# Patient Record
Sex: Male | Born: 1955 | Race: White | Hispanic: No | Marital: Single | Smoking: Never smoker
Health system: Southern US, Community
[De-identification: ages and names within clinical notes are randomized; demographics above are authoritative.]

## PROBLEM LIST (undated history)

## (undated) DIAGNOSIS — K219 Gastro-esophageal reflux disease without esophagitis: Secondary | ICD-10-CM

## (undated) DIAGNOSIS — E785 Hyperlipidemia, unspecified: Secondary | ICD-10-CM

## (undated) DIAGNOSIS — K5790 Diverticulosis of intestine, part unspecified, without perforation or abscess without bleeding: Secondary | ICD-10-CM

## (undated) DIAGNOSIS — G40909 Epilepsy, unspecified, not intractable, without status epilepticus: Secondary | ICD-10-CM

## (undated) DIAGNOSIS — K760 Fatty (change of) liver, not elsewhere classified: Secondary | ICD-10-CM

## (undated) DIAGNOSIS — N200 Calculus of kidney: Secondary | ICD-10-CM

## (undated) DIAGNOSIS — H9193 Unspecified hearing loss, bilateral: Secondary | ICD-10-CM

## (undated) DIAGNOSIS — L719 Rosacea, unspecified: Secondary | ICD-10-CM

## (undated) DIAGNOSIS — K2970 Gastritis, unspecified, without bleeding: Secondary | ICD-10-CM

## (undated) DIAGNOSIS — F419 Anxiety disorder, unspecified: Secondary | ICD-10-CM

## (undated) DIAGNOSIS — G4700 Insomnia, unspecified: Secondary | ICD-10-CM

## (undated) HISTORY — DX: Anxiety disorder, unspecified: F41.9

## (undated) HISTORY — DX: Gastritis, unspecified, without bleeding: K29.70

## (undated) HISTORY — DX: Rosacea, unspecified: L71.9

## (undated) HISTORY — DX: Insomnia, unspecified: G47.00

## (undated) HISTORY — DX: Calculus of kidney: N20.0

## (undated) HISTORY — DX: Hyperlipidemia, unspecified: E78.5

## (undated) HISTORY — PX: OTHER SURGICAL HISTORY: SHX169

## (undated) HISTORY — DX: Fatty (change of) liver, not elsewhere classified: K76.0

## (undated) HISTORY — DX: Diverticulosis of intestine, part unspecified, without perforation or abscess without bleeding: K57.90

## (undated) HISTORY — DX: Epilepsy, unspecified, not intractable, without status epilepticus: G40.909

## (undated) HISTORY — DX: Unspecified hearing loss, bilateral: H91.93

## (undated) HISTORY — DX: Gastro-esophageal reflux disease without esophagitis: K21.9

---

## 2005-06-26 ENCOUNTER — Ambulatory Visit (HOSPITAL_COMMUNITY): Admission: RE | Admit: 2005-06-26 | Discharge: 2005-06-26 | Payer: Self-pay | Admitting: Gastroenterology

## 2005-06-26 ENCOUNTER — Encounter (INDEPENDENT_AMBULATORY_CARE_PROVIDER_SITE_OTHER): Payer: Self-pay | Admitting: *Deleted

## 2005-07-20 ENCOUNTER — Encounter (INDEPENDENT_AMBULATORY_CARE_PROVIDER_SITE_OTHER): Payer: Self-pay | Admitting: *Deleted

## 2007-01-24 ENCOUNTER — Observation Stay (HOSPITAL_COMMUNITY): Admission: EM | Admit: 2007-01-24 | Discharge: 2007-01-24 | Payer: Self-pay | Admitting: Emergency Medicine

## 2009-02-23 ENCOUNTER — Encounter (INDEPENDENT_AMBULATORY_CARE_PROVIDER_SITE_OTHER): Payer: Self-pay | Admitting: *Deleted

## 2009-02-23 ENCOUNTER — Ambulatory Visit: Payer: Self-pay | Admitting: Internal Medicine

## 2009-02-23 DIAGNOSIS — K644 Residual hemorrhoidal skin tags: Secondary | ICD-10-CM | POA: Insufficient documentation

## 2009-02-23 DIAGNOSIS — H60399 Other infective otitis externa, unspecified ear: Secondary | ICD-10-CM | POA: Insufficient documentation

## 2009-02-23 DIAGNOSIS — Q5569 Other congenital malformation of penis: Secondary | ICD-10-CM | POA: Insufficient documentation

## 2009-02-23 DIAGNOSIS — R569 Unspecified convulsions: Secondary | ICD-10-CM | POA: Insufficient documentation

## 2009-02-23 DIAGNOSIS — K219 Gastro-esophageal reflux disease without esophagitis: Secondary | ICD-10-CM | POA: Insufficient documentation

## 2009-02-23 DIAGNOSIS — Z87442 Personal history of urinary calculi: Secondary | ICD-10-CM | POA: Insufficient documentation

## 2009-03-30 ENCOUNTER — Ambulatory Visit: Payer: Self-pay | Admitting: Gastroenterology

## 2009-03-30 DIAGNOSIS — R1013 Epigastric pain: Secondary | ICD-10-CM | POA: Insufficient documentation

## 2009-03-30 DIAGNOSIS — R1319 Other dysphagia: Secondary | ICD-10-CM | POA: Insufficient documentation

## 2009-04-14 ENCOUNTER — Encounter: Payer: Self-pay | Admitting: Gastroenterology

## 2009-04-14 ENCOUNTER — Ambulatory Visit: Payer: Self-pay | Admitting: Gastroenterology

## 2009-04-16 ENCOUNTER — Encounter: Payer: Self-pay | Admitting: Gastroenterology

## 2009-04-19 ENCOUNTER — Ambulatory Visit (HOSPITAL_COMMUNITY): Admission: RE | Admit: 2009-04-19 | Discharge: 2009-04-19 | Payer: Self-pay | Admitting: Gastroenterology

## 2009-04-22 ENCOUNTER — Ambulatory Visit: Payer: Self-pay | Admitting: Gastroenterology

## 2009-04-22 LAB — CONVERTED CEMR LAB
ALT: 29 units/L (ref 0–53)
Basophils Relative: 0.5 % (ref 0.0–3.0)
Bilirubin, Direct: 0.1 mg/dL (ref 0.0–0.3)
Eosinophils Relative: 6.2 % — ABNORMAL HIGH (ref 0.0–5.0)
HCT: 43.8 % (ref 39.0–52.0)
Hemoglobin: 15.1 g/dL (ref 13.0–17.0)
Lymphs Abs: 1.8 10*3/uL (ref 0.7–4.0)
MCV: 87.7 fL (ref 78.0–100.0)
Monocytes Absolute: 0.4 10*3/uL (ref 0.1–1.0)
RBC: 5 M/uL (ref 4.22–5.81)
Total Protein: 6.8 g/dL (ref 6.0–8.3)
WBC: 6.3 10*3/uL (ref 4.5–10.5)

## 2009-05-03 ENCOUNTER — Telehealth: Payer: Self-pay | Admitting: Internal Medicine

## 2009-05-03 ENCOUNTER — Telehealth: Payer: Self-pay | Admitting: Gastroenterology

## 2009-05-10 ENCOUNTER — Ambulatory Visit: Payer: Self-pay | Admitting: Internal Medicine

## 2009-05-10 DIAGNOSIS — F411 Generalized anxiety disorder: Secondary | ICD-10-CM | POA: Insufficient documentation

## 2009-05-10 DIAGNOSIS — K2940 Chronic atrophic gastritis without bleeding: Secondary | ICD-10-CM | POA: Insufficient documentation

## 2009-05-10 DIAGNOSIS — R030 Elevated blood-pressure reading, without diagnosis of hypertension: Secondary | ICD-10-CM | POA: Insufficient documentation

## 2009-05-10 DIAGNOSIS — G4700 Insomnia, unspecified: Secondary | ICD-10-CM | POA: Insufficient documentation

## 2009-09-28 ENCOUNTER — Ambulatory Visit: Payer: Self-pay | Admitting: Internal Medicine

## 2009-09-28 DIAGNOSIS — M542 Cervicalgia: Secondary | ICD-10-CM | POA: Insufficient documentation

## 2009-11-08 ENCOUNTER — Ambulatory Visit: Payer: Self-pay | Admitting: Internal Medicine

## 2009-11-08 DIAGNOSIS — K649 Unspecified hemorrhoids: Secondary | ICD-10-CM | POA: Insufficient documentation

## 2009-11-08 LAB — CONVERTED CEMR LAB
ALT: 37 units/L (ref 0–53)
AST: 27 units/L (ref 0–37)
Alkaline Phosphatase: 44 units/L (ref 39–117)
BUN: 10 mg/dL (ref 6–23)
Bilirubin Urine: NEGATIVE
Bilirubin, Direct: 0.1 mg/dL (ref 0.0–0.3)
Eosinophils Relative: 3.5 % (ref 0.0–5.0)
GFR calc non Af Amer: 66.83 mL/min (ref >60)
HCT: 43.1 % (ref 39.0–52.0)
Hemoglobin, Urine: NEGATIVE
Lymphocytes Relative: 24.3 % (ref 12.0–46.0)
Lymphs Abs: 1.6 10*3/uL (ref 0.7–4.0)
Monocytes Relative: 8.8 % (ref 3.0–12.0)
Nitrite: NEGATIVE
PSA: 1.13 ng/mL (ref 0.10–4.00)
Platelets: 226 10*3/uL (ref 150.0–400.0)
Potassium: 3.4 meq/L — ABNORMAL LOW (ref 3.5–5.1)
Sodium: 142 meq/L (ref 135–145)
TSH: 1.34 microintl units/mL (ref 0.35–5.50)
Total Bilirubin: 0.9 mg/dL (ref 0.3–1.2)
Total CHOL/HDL Ratio: 6
Uric Acid, Serum: 8.4 mg/dL — ABNORMAL HIGH (ref 4.0–7.8)
Urobilinogen, UA: 0.2 (ref 0.0–1.0)
WBC: 6.7 10*3/uL (ref 4.5–10.5)

## 2009-11-09 LAB — CONVERTED CEMR LAB: Vit D, 25-Hydroxy: 18 ng/mL — ABNORMAL LOW (ref 30–89)

## 2010-01-17 ENCOUNTER — Encounter: Payer: Self-pay | Admitting: Internal Medicine

## 2010-01-20 ENCOUNTER — Ambulatory Visit (HOSPITAL_COMMUNITY): Admission: RE | Admit: 2010-01-20 | Discharge: 2010-01-20 | Payer: Self-pay | Admitting: Neurology

## 2010-04-11 ENCOUNTER — Telehealth: Payer: Self-pay | Admitting: Gastroenterology

## 2010-08-13 ENCOUNTER — Encounter: Payer: Self-pay | Admitting: Gastroenterology

## 2010-08-23 NOTE — Assessment & Plan Note (Signed)
Summary: NECK PAIN / NWS #   Vital Signs:  Patient profile:   55 year old male Height:      65 inches Weight:      181.25 pounds BMI:     30.27 O2 Sat:      97 % on Room air Temp:     97 degrees F oral Pulse rate:   72 / minute BP sitting:   124 / 82  (left arm) Cuff size:   regular  Vitals Entered ByZella Ball Ewing (September 28, 2009 10:36 AM)  O2 Flow:  Room air  CC: Neck Pain/Re   Primary Care Provider:  Oliver Barre, MD  CC:  Neck Pain/Re.  History of Present Illness: here with upper left neck pain in 2 wks, worse with moving the head to a certain position to left and up  slightly, with pain persistent and at least mod , sometimes severe.  Pain worse to stand, sit, but especailly to working on the computer at work.  Seemed to start overall with trying to reach an object on higher shelf at [employer] while shopping .  No headache, blurred vision, other back pain, bowel or bladder change, LE pain, weakness or numbness.  No fever, wt loss.    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 1 By Mouth Two Times A Day (11am and 11pm) 2)  Dexilant 60 Mg Cpdr (Dexlansoprazole) .... One Q Am 3)  Lexapro 10 Mg Tabs (Escitalopram Oxalate) .Marland Kitchen.. 1 By Mouth Once Daily 4)  Zolpidem Tartrate 10 Mg Tabs (Zolpidem Tartrate) .Marland Kitchen.. 1po At Bedtime As Needed  Current Medications (verified): 1)  Levetiracetam 1000 Mg Tabs  (Levetiracetam) .Marland Kitchen.. 1 By Mouth Two Times A Day (11am and 11pm) 2)  Dexilant 60 Mg Cpdr (Dexlansoprazole) .... One Q Am 3)  Lexapro 10 Mg Tabs (Escitalopram Oxalate) .Marland Kitchen.. 1 By Mouth Once Daily 4)  Zolpidem Tartrate 10 Mg Tabs (Zolpidem Tartrate) .Marland Kitchen.. 1po At Bedtime As Needed 5)  Oxycodone Hcl 5 Mg Tabs (Oxycodone Hcl) .Marland Kitchen.. 1po Q 6 Hrs As Needed 6)  Flexeril 5 Mg Tabs (Cyclobenzaprine Hcl) .Marland Kitchen.. 1 By Mouth Three Times A Day As Needed 7)  Prednisone 10 Mg Tabs (Prednisone) .... 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   Head:  normocephalic and atraumatic.   Eyes:  vision grossly intact, pupils equal, and pupils round.   Ears:  R ear normal and ear piercing(s) noted.   Nose:  no external deformity and no nasal discharge.   Mouth:  no gingival abnormalities and pharynx pink and moist.   Neck:  supple and no masses.   Lungs:  normal respiratory effort and normal breath sounds.   Heart:  normal rate and regular rhythm.   Msk:  mild to mod tender to left upper paracervical tender but neck FROM, no sweling, erythema or rash Extremities:  no edema, no erythema  Neurologic:  cranial nerves II-XII intact, strength normal in all extremities, and sensation intact to light touch.      Impression & Recommendations:  Problem # 1:  NECK PAIN, ACUTE (ICD-723.1)  acute, left high paracervical, suspect aggrevation of underlying DJD/DDD - for pain med, muscle relaxer trial, and prednisone burst and taper off, check routine films today, consider MRI/ortho if persists or worsens  Orders: T-Cervical Spine Comp 4 Views (72050TC)  His updated medication list for this problem includes:    Oxycodone Hcl 5 Mg Tabs (Oxycodone hcl) .Marland Kitchen... 1po q 6 hrs as needed    Flexeril 5 Mg Tabs (Cyclobenzaprine hcl) .Marland Kitchen... 1 by mouth three times a day as needed  Complete Medication List: 1)  Levetiracetam 1000 Mg Tabs (Levetiracetam) .Marland Kitchen.. 1 by mouth two times a day (11am and 11pm) 2)  Dexilant 60 Mg Cpdr (Dexlansoprazole) .... One q am 3)  Lexapro 10 Mg Tabs (Escitalopram oxalate) .Marland Kitchen.. 1 by mouth once daily 4)  Zolpidem Tartrate 10 Mg Tabs (Zolpidem tartrate) .Marland Kitchen.. 1po at bedtime as needed 5)  Oxycodone Hcl 5 Mg Tabs (Oxycodone hcl) .Marland Kitchen.. 1po q 6 hrs as needed 6)  Flexeril 5 Mg Tabs (Cyclobenzaprine hcl) .Marland Kitchen.. 1 by mouth three times a day as needed 7)  Prednisone 10 Mg Tabs (Prednisone) .... 4po qd for 3days, then 3po qd for 3days, then 2po qd for 3days, then 1po qd for 3 days, then stop  Patient Instructions: 1)  Please take all new medications as prescribed 2)  Please go to Radiology in the basement level for your X-Ray today  3)  Continue all previous medications as before this visit  4)  Call or return for persistent or worsening symptoms, for consideration of MRI and/or orthopedic evaluation 5)  Please schedule a follow-up appointment in 1 month with CPX labs Prescriptions: PREDNISONE 10 MG TABS (PREDNISONE) 4po qd for 3days, then 3po qd for 3days, then 2po qd for 3days, then 1po qd for 3 days, then stop  #30 x 0   Entered and Authorized by:   Corwin Levins MD   Signed by:   Corwin Levins MD on 09/28/2009   Method used:   Print then Give to Patient   RxID:   [id number] FLEXERIL 5 MG  TABS (CYCLOBENZAPRINE HCL) 1 by mouth three times a day as needed  #60 x 0   Entered and Authorized by:   Corwin Levins MD   Signed by:   Corwin Levins MD on 09/28/2009   Method used:   Print then Give to Patient   RxID:   [id number] OXYCODONE HCL 5 MG TABS (OXYCODONE HCL) 1po q 6 hrs as needed  #50 x 0   Entered and Authorized by:   Corwin Levins MD   Signed by:   Corwin Levins MD on 09/28/2009  Method used:   Print then Give to Patient   RxID:   [id number]

## 2010-08-23 NOTE — Progress Notes (Signed)
Summary: Dexilant refill  Phone Note Call from Patient Call back at Home Phone [phone]   Caller: Patient Call For: Dr. Jarold Motto Reason for Call: Refill Medication Summary of Call: [facility] on [facility] Initial call taken by: Karna Christmas,  April 11, 2010 10:51 AM  Follow-up for Phone Call        rx sent, pt aware Follow-up by: Harlow Mares CMA Duncan Dull),  April 11, 2010 11:15 AM    Prescriptions: DEXILANT 60 MG CPDR (DEXLANSOPRAZOLE) one q am  #90 x 3   Entered by:   Harlow Mares CMA (AAMA)   Authorized by:   Mardella Layman MD [facility]   Signed by:   Harlow Mares CMA (AAMA) on 04/11/2010   Method used:   Electronically to        [facility]* (retail)             ,          Ph: [phone]       Fax: [phone]   RxID:   [id number]

## 2010-08-23 NOTE — Letter (Signed)
Summary: [facility]   Imported By: Sherian Rein 01/26/2010 14:52:10  _____________________________________________________________________  External Attachment:    Type:   Image     Comment:   External Document

## 2010-08-23 NOTE — Assessment & Plan Note (Signed)
Summary: 6 mos f/u / #/cd   Vital Signs:  Patient profile:   55 year old male Height:      65 inches Weight:      179.38 pounds BMI:     29.96 O2 Sat:      95 % on Room air Temp:     96.8 degrees F oral Pulse rate:   71 / minute BP sitting:   120 / 84  (left arm) Cuff size:   regular  Vitals Entered ByZella Ball Ewing (November 08, 2009 9:11 AM)  O2 Flow:  Room air  CC: 6 month followup/RE   Primary Care Provider:  Oliver Barre, MD  CC:  6 month followup/RE.  History of Present Illness: here to f/u for yearly exam, forgot to do blood work last wk;  overall deoing well;  Pt denies CP, sob, doe, wheezing, orthopnea, pnd, worsening LE edema, palps, dizziness or syncope  Pt denies new neuro symptoms such as headache, facial or extremity weakness . Lost 6 lbs since last visit.  Requests  STD check today, though denies symptoms GU or genital o/w.    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 1 By Mouth Two Times A Day (11am and 11pm) 2)  Dexilant 60 Mg Cpdr (Dexlansoprazole) .... One Q Am 3)  Lexapro 10 Mg Tabs (Escitalopram Oxalate) .Marland Kitchen.. 1 By Mouth Once Daily 4)  Zolpidem Tartrate 10 Mg Tabs (Zolpidem Tartrate) .Marland Kitchen.. 1po At Bedtime As Needed 5)  Oxycodone Hcl 5 Mg Tabs (Oxycodone Hcl) .Marland Kitchen.. 1po Q 6 Hrs As Needed 6)  Flexeril 5 Mg Tabs (Cyclobenzaprine Hcl) .Marland Kitchen.. 1 By Mouth Three Times A Day As Needed 7)   Prednisone 10 Mg Tabs (Prednisone) .... 4po Qd For 3days, Then 3po Qd For 3days, Then 2po Qd For 3days, Then 1po Qd For 3 Days, Then Stop  Current Medications (verified): 1)  Levetiracetam 1000 Mg Tabs (Levetiracetam) .Marland Kitchen.. 1 By Mouth Two Times A Day (11am and 11pm) 2)  Dexilant 60 Mg Cpdr (Dexlansoprazole) .... One Q Am 3)  Aspir-Low 81 Mg Tbec (Aspirin) .Marland Kitchen.. 1po Once Daily 4)  Anusol-Hc 25 Mg Supp (Hydrocortisone Acetate) .Marland Kitchen.. 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         all otherwise negative per pt -  except hemorrhoids "acting up" as in the past  Physical Exam  General:  alert and overweight-appearing.   Head:  normocephalic and atraumatic.   Eyes:  vision grossly intact, pupils equal, and pupils round.   Ears:  R ear normal  and L ear normal.except left TM with scarring from prior surgury Nose:  no external deformity and no nasal discharge.   Mouth:  good dentition and pharynx pink and moist.   Neck:  supple and no masses.   Lungs:  normal respiratory effort and normal breath sounds.   Heart:  normal rate and regular rhythm.   Abdomen:  soft, non-tender, and normal bowel sounds.  , xyphoid bone is slight tender Rectal:  deferred per pt Genitalia:  Testes bilaterally descended without nodularity, tenderness or masses. No scrotal masses or lesions. No penis lesions or urethral discharge. Msk:  no joint tenderness and no joint swelling.   Extremities:  no edema, no erythema  Neurologic:  cranial nerves II-XII intact and strength normal in all extremities.   Skin:  color normal. , but has facial rash - sees derm Psych:  memory intact for recent and remote and moderately anxious.     Impression & Recommendations:  Problem # 1:  Preventive Health Care (ICD-V70.0)  Overall doing well, age appropriate education and counseling updated and referral for appropriate preventive services done unless declined, immunizations up to date or declined, diet counseling done if overweight, urged to quit smoking if smokes , most recent labs reviewed and current ordered if appropriate, ecg reviewed or declined (interpretation per ECG scanned in the EMR if done); information regarding Medicare Prevention requirements given if appropriate   Orders: TLB-BMP (Basic Metabolic Panel-BMET) (80048-METABOL) TLB-CBC Platelet - w/Differential (85025-CBCD) TLB-Hepatic/Liver Function Pnl (80076-HEPATIC) TLB-Lipid Panel (80061-LIPID) TLB-TSH (Thyroid Stimulating Hormone) (84443-TSH) TLB-PSA (Prostate Specific Antigen) (84153-PSA) TLB-Udip ONLY (81003-UDIP) TLB-Uric Acid, Blood (84550-URIC) T-Vitamin D (25-Hydroxy) (19622-29798) TLB-BMP (Basic Metabolic Panel-BMET) (80048-METABOL) TLB-CBC Platelet - w/Differential  (85025-CBCD) TLB-Hepatic/Liver Function Pnl (80076-HEPATIC) TLB-Lipid Panel (80061-LIPID) TLB-TSH (Thyroid Stimulating Hormone) (84443-TSH) TLB-PSA (Prostate Specific Antigen) (84153-PSA) TLB-Udip ONLY (81003-UDIP) T-Vitamin D (25-Hydroxy) (92119-41740)  Problem # 2:  HEMORRHOIDS (ICD-455.6) for anusol hc supp   Problem # 3:  SEXUALLY TRANSMITTED DISEASE, EXPOSURE TO (ICD-V01.6)  for labs per pt reqeust  Orders: T-HIV-1 (Screen) [phone]) T-RPR (Syphilis) [phone]) T-Herpes Simplex Type 2 (26378-58850) T-Chlamydia & GC Probe, Urine ([id number]/[id number])  Complete Medication List: 1)  Levetiracetam 1000 Mg Tabs (Levetiracetam) .Marland Kitchen.. 1 by mouth two times a day (11am and 11pm) 2)  Dexilant 60 Mg Cpdr (Dexlansoprazole) .... One q am 3)  Aspir-low 81 Mg Tbec (Aspirin) .Marland Kitchen.. 1po once daily 4)  Anusol-hc 25 Mg Supp (Hydrocortisone acetate) .Marland Kitchen.. 1 pr  two times a day as needed  Other Orders: TLB-B12 + Folate Pnl ([id number]-B12/FOL)  Patient Instructions: 1)  Please go to the Lab in the basement for your blood and/or urine tests today 2)  Continue all previous medications as before this visit  3)  Take an Aspirin every day - 81 mg -1  per day - COATED only 4)  Please schedule a follow-up appointment in 1 year or sooner if needed Prescriptions: ANUSOL-HC 25 MG SUPP (  HYDROCORTISONE ACETATE) 1 pr  two times a day as needed  #20 x 1   Entered and Authorized by:   Corwin Levins MD   Signed by:   Corwin Levins MD on 11/08/2009   Method used:   Print then Give to Patient   RxID:   [id number]

## 2010-10-10 ENCOUNTER — Other Ambulatory Visit: Payer: Self-pay | Admitting: Internal Medicine

## 2010-10-10 ENCOUNTER — Other Ambulatory Visit: Payer: 59

## 2010-10-10 ENCOUNTER — Encounter: Payer: Self-pay | Admitting: Internal Medicine

## 2010-10-10 ENCOUNTER — Ambulatory Visit (INDEPENDENT_AMBULATORY_CARE_PROVIDER_SITE_OTHER): Payer: 59 | Admitting: Internal Medicine

## 2010-10-10 ENCOUNTER — Encounter (INDEPENDENT_AMBULATORY_CARE_PROVIDER_SITE_OTHER): Payer: Self-pay | Admitting: *Deleted

## 2010-10-10 DIAGNOSIS — Z0000 Encounter for general adult medical examination without abnormal findings: Secondary | ICD-10-CM

## 2010-10-10 DIAGNOSIS — E785 Hyperlipidemia, unspecified: Secondary | ICD-10-CM

## 2010-10-10 DIAGNOSIS — J019 Acute sinusitis, unspecified: Secondary | ICD-10-CM | POA: Insufficient documentation

## 2010-10-10 DIAGNOSIS — R490 Dysphonia: Secondary | ICD-10-CM

## 2010-10-10 DIAGNOSIS — F411 Generalized anxiety disorder: Secondary | ICD-10-CM

## 2010-10-10 DIAGNOSIS — K649 Unspecified hemorrhoids: Secondary | ICD-10-CM

## 2010-10-10 LAB — HEPATIC FUNCTION PANEL
ALT: 41 U/L (ref 0–53)
AST: 29 U/L (ref 0–37)
Bilirubin, Direct: 0.1 mg/dL (ref 0.0–0.3)
Total Bilirubin: 0.7 mg/dL (ref 0.3–1.2)
Total Protein: 6.6 g/dL (ref 6.0–8.3)

## 2010-10-10 LAB — URINALYSIS
Leukocytes, UA: NEGATIVE
Nitrite: NEGATIVE
Specific Gravity, Urine: 1.025 (ref 1.000–1.030)
Urobilinogen, UA: 0.2 (ref 0.0–1.0)

## 2010-10-10 LAB — PSA: PSA: 1.02 ng/mL (ref 0.10–4.00)

## 2010-10-10 LAB — LDL CHOLESTEROL, DIRECT: Direct LDL: 130.6 mg/dL

## 2010-10-10 LAB — BASIC METABOLIC PANEL
Chloride: 103 mEq/L (ref 96–112)
Creatinine, Ser: 1.2 mg/dL (ref 0.4–1.5)
GFR: 69.26 mL/min (ref >60.00)
Potassium: 3.8 mEq/L (ref 3.5–5.1)

## 2010-10-10 LAB — CBC WITH DIFFERENTIAL/PLATELET
Basophils Relative: 0.3 % (ref 0.0–3.0)
Eosinophils Relative: 3.6 % (ref 0.0–5.0)
HCT: 41.8 % (ref 39.0–52.0)
MCV: 88.7 fl (ref 78.0–100.0)
Monocytes Absolute: 0.7 10*3/uL (ref 0.1–1.0)
Monocytes Relative: 9.4 % (ref 3.0–12.0)
Neutrophils Relative %: 60.6 % (ref 43.0–77.0)
RBC: 4.71 Mil/uL (ref 4.22–5.81)
WBC: 7.1 10*3/uL (ref 4.5–10.5)

## 2010-10-10 LAB — LIPID PANEL
Total CHOL/HDL Ratio: 6
Triglycerides: 282 mg/dL — ABNORMAL HIGH (ref 0.0–149.0)

## 2010-10-10 LAB — TSH: TSH: 1.34 u[IU]/mL (ref 0.35–5.50)

## 2010-10-18 DIAGNOSIS — Z0279 Encounter for issue of other medical certificate: Secondary | ICD-10-CM

## 2010-10-20 NOTE — Letter (Signed)
Summary: Out of Work  [facility]  [address] [place], [place] [address]   Phone: [phone]  Fax: [phone]    October 10, 2010   Employee:  Chayanne D Borah    To Whom It May Concern:   For Medical reasons, please excuse the above named employee from work for the following dates:  Start:   10/06/2010  End:   10/17/2010  If you need additional information, please feel free to contact our office.         Sincerely,    Dr. Oliver Barre

## 2010-10-20 NOTE — Assessment & Plan Note (Signed)
Summary: STRESS---STC   Vital Signs:  Patient profile:   55 year old male Height:      65 inches Weight:      190.50 pounds BMI:     31.82 O2 Sat:      96 % on Room air Temp:     97.8 degrees F oral Pulse rate:   70 / minute BP sitting:   110 / 80  (left arm) Cuff size:   regular  Vitals Entered By: Zella Ball Ewing CMA Duncan Dull) (October 10, 2010 10:11 AM)  O2 Flow:  Room air  CC: Stress, congestion and stomach pain/RE   Primary Care Provider:  Oliver Barre, MD  CC:  Stress and congestion and stomach pain/RE.  History of Present Illness: here for wellness and f/u;  incidtnly oiwth 3 days acute noset midl to mod facial pain, pressure, fever and greenish d/c, with prod cough, mild wheezing, hoarsenss.  Pt denies CP, worsening sob, doe, orthopnea, pnd, worsening LE edema, palps, dizziness or syncope  Pt denies new neuro symptoms such as headache, facial or extremity weakness   Pt denies polydipsia, polyuria  Overall good compliance with meds, trying to follow low chol diet, wt stable, little excercise however .   Marland [place]ove  No fever, wt loss, night sweats, loss of appetite or other constitutional symptoms except for current symtpoms.  Denies worsening depressive symptoms, suicidal ideation, or panic, though has some ongoing anxiety.  Pt states good ability with ADL's, low fall risk, home safety reviewed and adequate, no significant change in hearing or vision, trying to follow lower chol diet, and occasionally active only with regular excercise.   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 1 By Mouth Two Times A Day (11am and 11pm) 2)  Dexilant 60 Mg Cpdr (Dexlansoprazole) .... One Q Am 3)  Aspir-Low 81 Mg Tbec (Aspirin) .Marland Kitchen.. 1po Once Daily 4)  Anusol-Hc 25 Mg Supp (Hydrocortisone Acetate) .Marland Kitchen.. 1 Pr  Two Times A Day As Needed  Current Medications (verified): 1)  Levetiracetam 1000 Mg Tabs (Levetiracetam) .Marland Kitchen.. 1 By Mouth Two Times A Day (11am and 11pm) 2)  Dexilant 60 Mg Cpdr (Dexlansoprazole) .... One Q Am 3)  Aspir-Low 81 Mg Tbec (Aspirin) .Marland Kitchen.. 1po Once Daily 4)  Anusol-Hc 25 Mg Supp (Hydrocortisone Acetate) .Marland Kitchen.. 1 Pr  Two Times A Day As Needed 5)  Proctosol Hc 2.5 % Crea (Hydrocortisone) .... Use Asd Two Times A Day As Needed 6)  Azithromycin 250 Mg Tabs (Azithromycin) .... 2po Qd For 1 Day, Then 1po Qd For 4days, Then Stop 7)  Tessalon Perles 100 Mg Caps (Benzonatate) .Marland Kitchen.. 1-2 By Mouth Three Times A Day As Needed Cough 8)  Lexapro 10 Mg Tabs (Escitalopram Oxalate) .Marland Kitchen.. 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 GERD Anxiety  Past Surgical History: Last updated: 02/23/2009 s/p bilat ear surgury/eardrum replacements  Family History: Last updated: 2009/04/17 father died 91 - MI and stroke mother died 12-20-2000 - stroke and breast cancer brother with DM No FH of Colon Cancer:  Social History: Last updated: 04-17-09 Single no children work - former Counsellor; now at call center for [employer] Never  Smoked Alcohol use-no Illicit Drug Use - no  Risk Factors: Smoking Status: never (02/23/2009)  Review of Systems  The patient denies anorexia, fever, vision loss, decreased hearing, hoarseness, chest pain, syncope, dyspnea on exertion, peripheral edema, prolonged cough, headaches, hemoptysis, abdominal pain, melena, hematochezia, severe indigestion/heartburn, hematuria, muscle weakness, suspicious skin lesions, transient blindness, difficulty walking, depression, unusual weight change, abnormal bleeding, enlarged lymph nodes, and angioedema.         all otherwise negative per pt -  though also has some chronic hoarasness - asks for ent referral  Physical Exam  General:  alert and overweight-appearing. , mild ill   Head:  normocephalic and atraumatic.   Eyes:  vision grossly intact, pupils equal, and pupils round.   Ears:  bilat tm's red, sinus tender bilat Nose:  nasal dischargemucosal pallor and mucosal edema.   Mouth:  pharyngeal erythema and fair dentition.   Neck:  supple and no masses.   Lungs:  normal respiratory effort and normal breath sounds.   Heart:  normal rate and regular rhythm.   Abdomen:  soft, non-tender, and normal bowel sounds.   Msk:  no joint tenderness and no joint swelling.   Extremities:  no edema, no erythema  Neurologic:  cranial nerves II-XII intact and strength normal in all extremities.   Skin:  color normal and no rashes.   Psych:  not depressed appearing and moderately anxious.     Impression & Recommendations:  Problem # 1:  Preventive Health Care (ICD-V70.0)  Overall doing well, age appropriate education and counseling updated, referral for preventive services and immunizations addressed, dietary counseling and smoking status adressed , most recent labs reviewed I have personally reviewed and have noted 1.The patient's medical and social history 2.Their use of alcohol, tobacco or illicit drugs 3.Their current medications and supplements 4.  Functional ability including ADL's, fall risk, home safety risk, hearing & visual impairment  5.Diet and physical activities 6.Evidence for depression or mood disorders The patients weight, height, BMI  have been recorded in the chart I have made referrals, counseling and provided education to the patient based review of the above   Orders: TLB-BMP (Basic Metabolic Panel-BMET) (80048-METABOL) TLB-CBC Platelet - w/Differential (85025-CBCD) TLB-Hepatic/Liver Function Pnl (80076-HEPATIC) TLB-Lipid Panel (80061-LIPID) TLB-PSA (Prostate Specific Antigen) (84153-PSA) TLB-TSH (Thyroid Stimulating Hormone) (84443-TSH) TLB-Udip ONLY (81003-UDIP)  Problem # 2:  HEMORRHOIDS (ICD-455.6) treat as above, f/u any worsening signs or symptoms - see emr for prescriptoins done, f/u any worsening   Problem # 3:  SINUSITIS- ACUTE-NOS (ICD-461.9)  His updated medication list for this problem includes:    Azithromycin 250 Mg Tabs (Azithromycin) .Marland Kitchen... 2po qd for 1 day, then 1po qd for 4days, then stop    Tessalon Perles 100 Mg Caps (Benzonatate) .Marland Kitchen... 1-2 by mouth three times a day as needed cough  Instructed on treatment. Call if symptoms persist or worsen.  treat as above, f/u any worsening signs or symptoms  Problem # 4:  HOARSENESS (ZOX-096.04)  acute on chronic - for ent referral, ok off work for this wk only due to current illness and needs voice rest  Orders: ENT Referral (ENT)  Problem # 5:  ANXIETY (ICD-300.00)  His updated medication list for this problem includes:    Lexapro 10 Mg Tabs (Escitalopram oxalate) .Marland Kitchen... 1 by mouth once daily treat as above, f/u any worsening signs or symptoms   Discussed medication use and relaxation techniques.   Complete Medication List: 1)  Levetiracetam 1000 Mg Tabs (Levetiracetam) .Marland Kitchen.. 1 by mouth two times a day (11am and 11pm) 2)  Dexilant 60 Mg Cpdr (Dexlansoprazole) .... One q am 3)  Aspir-low 81 Mg Tbec (Aspirin) .Marland Kitchen.. 1po once daily 4)  Anusol-hc  25 Mg Supp (Hydrocortisone acetate) .Marland Kitchen.. 1 pr  two times a day as needed 5)  Proctosol Hc 2.5 % Crea (Hydrocortisone) .... Use asd two times a day as needed 6)  Azithromycin 250 Mg Tabs (Azithromycin) .... 2po qd for 1 day, then 1po qd for 4days, then stop 7)  Tessalon Perles 100 Mg Caps (Benzonatate) .Marland Kitchen.. 1-2 by mouth three times a day as needed cough 8)  Lexapro 10 Mg Tabs (Escitalopram oxalate) .Marland Kitchen.. 1 by mouth once daily  Patient Instructions: 1)  Please take all new medications as prescribed 2)  Continue all previous medications as before this visit  3)  You will be contacted about the referral(s) to: ENT 4)  You are given the work note today 5)  Please go to the Lab in the basement for your blood and/or urine tests today  6)  Please call the number on the [facility] Card for results of your testing  7)  Please schedule a follow-up appointment in 1 year, or sooner if needed Prescriptions: LEXAPRO 10 MG TABS (ESCITALOPRAM OXALATE) 1 by mouth once daily  #90 x 3   Entered and Authorized by:   Corwin Levins MD   Signed by:   Corwin Levins MD on 10/10/2010   Method used:   Print then Give to Patient   RxID:   [id number] TESSALON PERLES 100 MG CAPS (BENZONATATE) 1-2 by mouth three times a day as needed cough  #60 x 0   Entered and Authorized by:   Corwin Levins MD   Signed by:   Corwin Levins MD on 10/10/2010   Method used:   Print then Give to Patient   RxID:   [id number] AZITHROMYCIN 250 MG TABS (AZITHROMYCIN) 2po qd for 1 day, then 1po qd for 4days, then stop  #6 x 1   Entered and Authorized by:   Corwin Levins MD   Signed by:   Corwin Levins MD on 10/10/2010   Method used:   Print then Give to Patient   RxID:   [id number] PROCTOSOL HC 2.5 % CREA (HYDROCORTISONE) use asd two times a day as needed  #1large x 0   Entered and Authorized by:   Corwin Levins MD   Signed by:   Corwin Levins MD on 10/10/2010   Method used:   Print then Give to Patient   RxID:    [id number] ANUSOL-HC 25 MG SUPP (HYDROCORTISONE ACETATE) 1 pr  two times a day as needed  #20 x 0   Entered and Authorized by:   Corwin Levins MD   Signed by:   Corwin Levins MD on 10/10/2010   Method used:   Print then Give to  Patient   RxID:   [id number]    Orders Added: 1)  TLB-BMP (Basic Metabolic Panel-BMET) [80048-METABOL] 2)  TLB-CBC Platelet - w/Differential [85025-CBCD] 3)  TLB-Hepatic/Liver Function Pnl [80076-HEPATIC] 4)  TLB-Lipid Panel [80061-LIPID] 5)  TLB-PSA (Prostate Specific Antigen) [84153-PSA] 6)  TLB-TSH (Thyroid Stimulating Hormone) [84443-TSH] 7)  TLB-Udip ONLY [81003-UDIP] 8)  Est. Patient 40-64 years [99396] 9)  ENT Referral [ENT] 10)  Est. Patient Level III [59563]

## 2010-12-06 NOTE — H&P (Signed)
NAME:  Tyler Moran, Tyler Moran                ACCOUNT NO.:  [id number]   MEDICAL RECORD NO.:  [id number]          PATIENT TYPE:  EMS   LOCATION:  [facility]                         FACILITY:  [facility]   PHYSICIAN:  Hollice Espy, M.D.DATE OF BIRTH:  03-29-1956   DATE OF ADMISSION:  01/24/2007  DATE OF DISCHARGE:                              HISTORY & PHYSICAL   PRIMARY CARE PHYSICIAN:  Jethro Bastos, M.D.   CHIEF COMPLAINT:  Seizure.   HISTORY OF PRESENT ILLNESS:  The patient is a 55 year old, white male  with a past medical history of GERD only who has been doing otherwise  relatively well, no recent illnesses, no trauma, no injuries to the  head, past or present, no fevers or chills, no complaints who was at  home today when according to his  roommate the patient went to bed in  his own room and all of the sudden the roommate heard some noises. It  was not clear as to how long the roommate heard the noises before  checking on the patient. He found to be having wild movements of the  arms and legs which the patient's roommate was unable to stop the  patient from doing. He then noted some blood coming from the patient's  mouth where the patient had apparently bitten his tongue. These  movements then ceased and the patient appeared to be quite confused  staring straight ahead with the patient's roommate unable to awaken him  fully. Paramedics were called and eventually over time the patient  became more conscious and after being seen in the emergency room, a CT  scan of the head was done which was unremarkable as were T spine x-rays  with the patient complaining of some new onset lower back pain since his  seizure. No previous problems before. Vitals were done on the patient  and his initial heart rate was 98 but since then has come down to 67;  his blood pressure has been stable, the rest of his vitals were  unremarkable. The patient currently appears to be fully alert and  oriented and other  than no knowledge or recollection of the seizure-like  event says he feels completely normal. His roommate confirmed that he is  at his baseline. The patient is currently doing well, denies any  headaches, vision changes, dysphagia, chest pain, palpitations,  shortness of breath, wheezing, coughing, abdominal pain, hematuria,  dysuria, constipation, diarrhea, focal extremity, numbness, weakness or  pain. His only complaint is of some mid spinal lower back pain that has  been hurting every since this reported seizure-like event. His review of  systems is otherwise negative.   PAST MEDICAL HISTORY:  GERD.   MEDICATIONS:  He is on a PPI.   ALLERGIES:  None.   SOCIAL HISTORY:  He denies any tobacco, alcohol or drug use.   FAMILY HISTORY:  Noncontributory.   PHYSICAL EXAMINATION:  VITAL SIGNS:  Temperature 97.2, heart rate 98,  blood pressure 111/67, respirations 18, O2 sat 99% on room air.  GENERAL:  The patient is alert and oriented x3 in no apparent distress.  HEENT:  Normocephalic, atraumatic. His mucous membranes are moist. He  has no carotid bruits.  HEART:  Regular rate and rhythm. S1, S2.  LUNGS:  Clear to auscultation bilaterally.  ABDOMEN:  Soft, nontender, nondistended, positive bowel sounds.  EXTREMITIES:  Show no clubbing, cyanosis or edema.  NEUROLOGIC:  He has no focal neurological deficits.   LABORATORY DATA:  CT scan of the head is unremarkable. Sodium 137,  potassium 3.5, chloride 104, bicarb 21, BUN 12, creatinine 1.4, glucose  110. White count 8.2, no shift. H&H is 14.7 and 43.3. MCV of 86,  platelet count 251. UA showed 30 proteins and trace hemoglobin,  otherwise negative with 0-2 white and red cells. Urine drug screen  completely normal.   ASSESSMENT/PLAN:  1. New onset seizure. The first thing we need to do is clarify whether      or not this could be a seizure. Will check an EEG and a MRI/MRA of      the brain. Will also ask neurology for a consult. There  is a      possibility given the fact that the roommate said this occurred for      an unknown length of time that perhaps this may not have been a      seizure and may have possibly been some sort of dream manifestation      although the postictal confusion which lasted for some time and the      tongue biting may go against this. Nevertheless will look for      possible etiologies and if none can be found we will need to      determine whether or not the patient needs to go on seizure      medication and if so what length of time and how long he should be      restricted from driving.  2. Gastroesophageal reflux disease. Continue proton pump inhibitor.  3. Renal insufficiency. Mild likely from dehydration from this      possible seizure-like event. Will gently hydrate.      Hollice Espy, M.D.  Electronically Signed     SKK/MEDQ  D:  01/24/2007  T:  01/24/2007  Job:  [id number]   cc:   Jethro Bastos, M.D.

## 2010-12-06 NOTE — Consult Note (Signed)
NAME:  LENNART, GLADISH                ACCOUNT NO.:  [id number]   MEDICAL RECORD NO.:  [id number]          PATIENT TYPE:  INP   LOCATION:  4710                         FACILITY:  [facility]   PHYSICIAN:  Deanna Artis. Hickling, M.D.DATE OF BIRTH:  [birth year]   DATE OF CONSULTATION:  01/24/2007  DATE OF DISCHARGE:                                 CONSULTATION   CHIEF COMPLAINT:  Seizure.   HISTORY OF THE PRESENT CONDITION:  Marie Chow is a 55 year old right-  handed Caucasian gentleman who had a nocturnal seizure.  The patient had  been well during the day.  Around 2:30 in the morning, his roommate who  was sleeping in a separate room, both doors closed, heard noises that  persisted.  He went in to check on the patient and found that the  patient had wild movements of his arms and legs which when I asked him  to describe it, appeared to be tonic-clonic jerking.   The patient's roommate was unable to stop his movements.  He noted blood  coming from the patient's mouth.  The patient had bitten the right side  of his tongue.  The movements gradually subsided.  The patient was  confused, staring straight ahead.  He is unable to awaken fully.  He has  no memory for the event.   EMS was called.  He was transported to [facility].  A CT scan  of the brain was done and was normal.  Spinal x-rays were also carried  out because the patient complained of lower back pain.  These too were  negative.  The patient initially had tachycardia.  He had normal  glucose.  The patient was admitted for evaluation of his new onset of  seizures.   PAST MEDICAL HISTORY:  Acid reflux with gastroesophageal reflux disease.   PAST SURGICAL HISTORY:  The patient says he has had previous ear surgery  in both ears.   FAMILY HISTORY:  Positive for seizures in paternal grandfather who may  have used alcohol to excess, in his sister who had a single seizure, and  in her daughter who has had multiple seizures and has  been diagnosed as  having epilepsy.  No other history is mental retardation, blindness,  deafness, birth defects dementia, multiple sclerosis or Parkinson's  disease.   SOCIAL HISTORY:  The patient works in a Designer, jewellery.  He denies the use of alcohol, tobacco, or drugs.  He lives with another  gentleman who is about his age.   CURRENT MEDICATIONS:  Protonix.   DRUG ALLERGIES:  None known.   PHYSICAL EXAMINATION:  GENERAL:  Examination today, this is a well-  developed, well-nourished bearded gentleman who was somewhat anxious in  no acute distress.  VITAL SIGNS:  Blood pressure 120/82, resting pulse 71, respirations 18,  temperature 97.5.  Oxygen saturation 98% on room.  EARS, NOSE, AND THROAT:  No bruits or meningismus.  Tympanic membranes  are dull.  There is no infection.  He has bitten the right side of his  tongue.  LUNGS:  Clear.  HEART:  No murmurs.  Pulses normal.  ABDOMEN:  Soft.  Bowel sounds normal.  No hepatosplenomegaly.  EXTREMITIES:  Normal.  NEUROLOGIC EXAMINATION/MENTAL STATUS:  Awake, alert without dysphasia or  dyspraxia.  He is somewhat nervous and anxious.  Cranial nerves round  and reactive.  Pupils and visual fields full.  Extraocular movements  full and conjugate.  Symmetric facial strength.  Midline tongue.  Air  conduction greater than bone conduction bilaterally.   Motor examination:  Normal strength, tone and mass.  Good fine motor  movements.  No pronator drift.  Sensation intact to cold, vibration,  proprioception, and stereoagnosis.  Cerebellar examination:  Good finger-  to-nose, rapid alternating movements.  Heel-knee-shin and finger-to-nose  were normal.  Gait was normal.  He can walk on his heels and toes and  perform tandem without difficulty.  Balance is only fair.  Deep tendon  reflexes are diminished to absent.  He had bilateral flexor plantar  responses.   IMPRESSION:  1. Single seizure (generalized tonic-clonic) not  definitely epilepsy.      Code:  780.39.  2. Positive family history of recurrent seizures in his father which      may been alcohol related, single seizure not definitely epilepsy in      his sister, and epilepsy in his niece.  There appeared to be no      other risk factors for seizures.  His roommate wonders if he has      not had episodes of brief alteration of awareness, which either may      be day dreaming, or could be nonconvulsive seizures.   PLAN:  I intend to review the MRI scan and the EEG which were performed  today.  We will decide, at that time, on whether any antiepileptic drugs  are appropriate or not.  In his EEG is negative.  I would not place him  on antiepileptic drugs at this time.  Because of seizure was nocturnal I  would like to keep out of his car for only about a month and then allow  him to drive short distances for the next 6 months.  If he has any more  nocturnal seizures, I will feel more comfortable considering this a  nocturnal seizure disorder.  If, however, he has a seizure during the  day; he will not be able to drive for 6 months following that seizure.  In all likelihood with recurrent seizures we will place him on  medication.  If he has a floridly abnormal EEG, I also might put him on  medication.  I appreciate the opportunity to participate in his care.      Deanna Artis. Sharene Skeans, M.D.  Electronically Signed     WHH/MEDQ  D:  01/24/2007  T:  01/25/2007  Job:  [id number]   cc:   Hollice Espy, M.D.  Jethro Bastos, M.D.

## 2010-12-06 NOTE — Procedures (Signed)
EEG NUMBER:  [id number].   CLINICAL HISTORY:  The patient is a 55 year old who had a witnessed  single generalized tonic-clonic seizure.  The study is being done to  look for the presence of a seizure disorder (780.39).   PROCEDURE:  The tracing is carried out on a 32 digital Cadwell recorder  reformatted into 16 channel montages with one devoted to EKG.  The  patient was awake during the recording and drowsy.  The [employer]  10/20 system lead placement was used.  The patient takes Protonix.   Description of findings:  Dominant frequency is a 10 Hz 20 microvolt  activity.  Superimposed upon this is mixed frequency theta and  upper  delta range activity that is frontal and generalized and occurs  intermittently.  Frontally predominant under 10 microvolt beta range  activity was seen.   Activating procedures with photic stimulation induced a driving response  between 5-11 Hz.  Hyperventilation caused generalized theta and delta  range activity of 50 microvolts with somewhat sharp contour to the  background.  There was no interictal epileptiform activity in the form  of spikes or sharp waves.   EKG showed regular sinus rhythm with ventricular response of 66 beats  per minute.   IMPRESSION:  Abnormal EEG on the basis of mild diffuse background  slowing.  This is likely related to postictal state.  The findings are  nonspecific.  No seizure activity was seen.      Deanna Artis. Sharene Skeans, M.D.  Electronically Signed     BJY:NWGN  D:  01/24/2007 18:54:18  T:  01/25/2007 11:37:56  Job #:  [id number]   cc:   Barnetta Chapel, MD

## 2010-12-09 NOTE — Discharge Summary (Signed)
NAME:  Tyler Moran, Tyler Moran                ACCOUNT NO.:  [id number]   MEDICAL RECORD NO.:  [id number]          PATIENT TYPE:  INP   LOCATION:  4710                         FACILITY:  [facility]   PHYSICIAN:  Barnetta Chapel, MDDATE OF BIRTH:  02/05/1956   DATE OF ADMISSION:  01/24/2007  DATE OF DISCHARGE:  01/24/2007                               DISCHARGE SUMMARY   Reviewed by neurology and discharge back home the same day, January 24, 2007.   PRIMARY CARE PHYSICIAN:  Jethro Bastos, M.D.   DISCHARGE DIAGNOSES:  1. New onset seizure.  2. Gastroesophageal reflux disease.  3. Renal insufficiency.   CONSULTATIONS:  Neurology, Deanna Artis. Sharene Skeans, M.D.   BRIEF HISTORY AND HOSPITAL COURSE:  Please refer to the H&P done earlier  on the day of January 24, 2007.  The patient is a 55 year old male with past  medical history significant for GERD who presented with new episodes of  tonic-clonic jerking.  Patient's problem began at 2:30 in the morning  while the patient was sleeping.  EMS was called by the patient's  roommate, and the patient was brought to [facility]. CT scan of  the brain and was normal.   During the hospital stay, the patient had an MRI and EEG which the  neurologist's plans to review.  The neurologist decided that it was okay  for the patient to be discharged home without any seizure medications.  The patient was discharged on the same day as admission.   PLAN:  1. Discharge patient home.  2. Follow up with primary care physician.      Barnetta Chapel, MD  Electronically Signed     SIO/MEDQ  D:  03/12/2007  T:  03/12/2007  Job:  [id number]   cc:   Jethro Bastos, M.D.

## 2010-12-09 NOTE — Op Note (Signed)
NAME:  Tyler Moran, Tyler Moran                 ACCOUNT NO.:  [id number]   MEDICAL RECORD NO.:  [id number]          PATIENT TYPE:  AMB   LOCATION:  ENDO                         FACILITY:  [facility]   PHYSICIAN:  John C. Madilyn Fireman, M.D.    DATE OF BIRTH:  [birth year]   DATE OF PROCEDURE:  06/26/2005  DATE OF DISCHARGE:                                 OPERATIVE REPORT   INDICATIONS FOR PROCEDURE:  Average risk colon cancer screening in a patient  who has also been treated empirically for bouts consistent with  diverticulitis.   PROCEDURE:  The patient was placed in the left lateral decubitus position  and placed on the pulse monitor with continuous low-flow oxygen, delivered  by nasal cannula. He was sedated with 50 mcg IV fentanyl and 5 mg IV Versed.  The Olympus video colonoscope was inserted into the rectum and advanced into  the cecum, confirmed by transillumination of McBurney's point and  visualization of the ileocecal valve and appendiceal orifice. The prep was  good. The cecum ascending, transverse, and descending colon all appeared  normal with no masses, polyps, diverticula or other mucosal abnormalities.  There were approximately two tiny diverticula seen in the sigmoid colon. No  signs of inflammation. The rectum appeared normal and on retroflex view the  anus revealed no obvious internal hemorrhoids.   The scope was then withdrawn and the patient returned to the recovery room  in stable condition. He tolerated the procedure well; and there were no  immediate complications.   IMPRESSION:  One or two small diverticula seen, otherwise normal study.   PLAN:  Repeat colonoscopy in 10 years and consider sigmoidoscopy and/or  Hemoccults in 5 years.           ______________________________  Everardo All Madilyn Fireman, M.D.     JCH/MEDQ  D:  06/26/2005  T:  06/26/2005  Job:  [id number]   cc:   Jethro Bastos, M.D.  Fax: [phone]

## 2010-12-09 NOTE — Op Note (Signed)
NAME:  Tyler Moran, Tyler Moran                 ACCOUNT NO.:  [id number]   MEDICAL RECORD NO.:  [id number]          PATIENT TYPE:  OUT   LOCATION:  ULT                          FACILITY:  [facility]   PHYSICIAN:  John C. Madilyn Fireman, M.D.    DATE OF BIRTH:  Sep 27, 1955   DATE OF PROCEDURE:  07/20/2005  DATE OF DISCHARGE:                                 OPERATIVE REPORT   PROCEDURE PERFORMED:  Colonoscopy.   INDICATIONS FOR PROCEDURE:  Average-risk colon cancer screening in a  55-  year-old patient.   PROCEDURE:  The patient was placed in the left lateral decubitus position  and placed on the pulse monitor with continuous low-flow oxygen delivered by  nasal cannula.  He was sedated with 100 mcg IV fentanyl and 6 mg IV Versed.  The Olympus video colonoscope was inserted into the rectum and advanced to  the cecum, confirmed by transillumination of McBurney's point and  visualization of ileocecal valve and appendiceal orifice. The prep was  excellent. The cecum, ascending, transverse and descending colon all  appeared normal, with no masses, polyps, diverticula or other mucosal  abnormalities.  Within the sigmoid colon there was seen a few scattered  diverticula. No other abnormalities. The rectum appeared normal. In  retroflexed view the anus revealed no obvious internal hemorrhoids. The  scope was then withdrawn and the patient returned to the recovery room in  stable condition.  He tolerated the procedure well and there were no  immediate complications.   IMPRESSION:  1.  Diverticulosis.  2.  Otherwise normal study.   PLAN:  Will repeat colonoscopy in 10 years and consider flexible  sigmoidoscopy in 5 years.           ______________________________  Everardo All Madilyn Fireman, M.D.     JCH/MEDQ  D:  07/20/2005  T:  07/20/2005  Job:  [id number]

## 2010-12-09 NOTE — Op Note (Signed)
NAME:  Tyler Moran, Tyler Moran                 ACCOUNT NO.:  [id number]   MEDICAL RECORD NO.:  [id number]          PATIENT TYPE:  AMB   LOCATION:  ENDO                         FACILITY:  [facility]   PHYSICIAN:  John C. Madilyn Fireman, M.D.    DATE OF BIRTH:  October 08, 1955   DATE OF PROCEDURE:  06/26/2005  DATE OF DISCHARGE:                                 OPERATIVE REPORT   PROCEDURE:  Esophagogastroduodenoscopy   INDICATIONS FOR PROCEDURE:  Patient with gastroesophageal reflux not  completely responsive to proton pump inhibitor.  He was also undergoing  screening colonoscopy, today.  The procedure is primarily to assess for  complicated reflux disease and on his one time  screening for Barrett's  esophagus.   PROCEDURE:  The patient was placed in the left lateral decubitus position  and placed on the pulse monitor with continuous low-flow oxygen, delivered  by nasal cannula. He was sedated with 50 mcg IV fentanyl and 5 mg IV Versed.  The Olympus video endoscope was advanced under direct vision into the  oropharynx and esophagus. The esophagus was straight and of normal caliber.  The squamocolumnar line was at 38 cm. There was no visible esophagitis,  ring, stricture, hiatal hernia, or other abnormalities of the GE junction or  distal esophagus. The stomach was entered and a small amount of liquid  secretions were suctioned from the fundus. Retroflex view of cardia was  unremarkable. The fundus, body, antrum and pylorus all appeared normal. The  duodenum was entered and both bulb and second portion were well inspected  and appeared to be within normal limits. The scope was then withdrawn, and  the patient appeared for colonoscopy.  He tolerated the procedure well.  There were no immediate complications.   IMPRESSION:  Normal endoscopy.   PLAN:  Continue proton pump inhibitor, consider increasing to b.i.d. dosing  and reinforced dietary and lifestyle modifications for reflux.     ______________________________  Everardo All. Madilyn Fireman, M.D.     JCH/MEDQ  D:  06/26/2005  T:  06/26/2005  Job:  [id number]   cc:   Jethro Bastos, M.D.  Fax: [phone]

## 2011-02-16 ENCOUNTER — Other Ambulatory Visit: Payer: Self-pay | Admitting: Internal Medicine

## 2011-03-13 ENCOUNTER — Other Ambulatory Visit: Payer: Self-pay | Admitting: Gastroenterology

## 2011-03-21 ENCOUNTER — Encounter: Payer: Self-pay | Admitting: Internal Medicine

## 2011-03-21 DIAGNOSIS — Z0000 Encounter for general adult medical examination without abnormal findings: Secondary | ICD-10-CM | POA: Insufficient documentation

## 2011-03-22 ENCOUNTER — Ambulatory Visit (INDEPENDENT_AMBULATORY_CARE_PROVIDER_SITE_OTHER): Payer: 59 | Admitting: Internal Medicine

## 2011-03-22 ENCOUNTER — Ambulatory Visit (INDEPENDENT_AMBULATORY_CARE_PROVIDER_SITE_OTHER)
Admission: RE | Admit: 2011-03-22 | Discharge: 2011-03-22 | Disposition: A | Discharge status: Home / Self Care | Payer: 59 | Source: Ambulatory Visit | Attending: Internal Medicine | Admitting: Internal Medicine

## 2011-03-22 ENCOUNTER — Encounter: Payer: Self-pay | Admitting: Internal Medicine

## 2011-03-22 VITALS — BP 140/82 | HR 78 | Temp 97.6°F | Ht 65.0 in | Wt 190.8 lb

## 2011-03-22 DIAGNOSIS — M790 Rheumatism, unspecified: Secondary | ICD-10-CM

## 2011-03-22 DIAGNOSIS — Z0000 Encounter for general adult medical examination without abnormal findings: Secondary | ICD-10-CM

## 2011-03-22 DIAGNOSIS — R030 Elevated blood-pressure reading, without diagnosis of hypertension: Secondary | ICD-10-CM

## 2011-03-22 DIAGNOSIS — M7918 Myalgia, other site: Secondary | ICD-10-CM

## 2011-03-22 DIAGNOSIS — E785 Hyperlipidemia, unspecified: Secondary | ICD-10-CM

## 2011-03-22 HISTORY — DX: Hyperlipidemia, unspecified: E78.5

## 2011-03-22 MED ORDER — NAPROXEN 500 MG PO TABS: 500.0000 mg | ORAL_TABLET | Freq: Two times a day (BID) | ORAL | Status: DC

## 2011-03-22 MED ORDER — CYCLOBENZAPRINE HCL 5 MG PO TABS: 5.0000 mg | ORAL_TABLET | Freq: Three times a day (TID) | ORAL | Status: AC | PRN

## 2011-03-22 NOTE — Patient Instructions (Addendum)
Take all new medications as prescribed Continue all other medications as before Please go to XRAY in the Basement for the x-ray test Please call the phone number [phone] (the [employer]Tree System) for results of testing in 2-3 days;  When calling, simply dial the number, and when prompted enter the MRN number above (the Medical Record Number) and the # key, then the message should start. Please return in March 2013 with Lab testing done 3-5 days before, or sooner if needed

## 2011-03-22 NOTE — Assessment & Plan Note (Signed)
Exam c/w I suspect a rather difffuse left post/ant neck/trapezoid, left upper chest and shoulder soft tissue myofascial discomfort for unclear reasons, that does not seem neuritic but cant r/o underlying DJD/DDD - for several films today to r/o significant dz, nsaid prn, flexeril prn, and  to f/u any worsening symptoms or concerns

## 2011-03-22 NOTE — Progress Notes (Signed)
  Subjective:    Patient ID: Tyler Moran, male    DOB: September 10, 1955, 55 y.o.   MRN: [id number]  HPI  Here with onset 2 wks pain to left neck, worse at night for unclear reasons, ? Cramp like, points to the left Norco distruibution, no radiation, or UE symptoms such as pain/weakness/numbness;  Has some ST to swallow, thought might be related to reflux, has been on dexilnat 60 mg for at least 2 yrs per Dr Ulyses Amor; has occas breakthrough with spicy foods but has not eaten that lately; pain better with OTC pain relievers (not sure of name) but worried he is taking too much.  No bowel or bladder changes, gait changes, falls. No trauma, or prior hx of same.  No heavy lifiting, and does not get regular exercise.  Wt has increased some , and admits to "lees than perfect diet." Pain to left neck on exertional, no pleuritic;  Seems uncomfortable , constant, mild and just cant really tell if worse with neck moveement though. Sister asked him to ask about FMS.   Pt denies fever, wt loss, night sweats, loss of appetite, or other constitutional symptoms.     Past Medical History  Diagnosis Date  . Hyperlipidemia 03/22/2011   No past surgical history on file.  reports that he has never smoked. He does not have any smokeless tobacco history on file. He reports that he does not drink alcohol or use illicit drugs. family history is not on file. No Known Allergies No current outpatient prescriptions on file prior to visit.   Review of Systems Review of Systems  Constitutional: Negative for diaphoresis and unexpected weight change.  HENT: Negative for drooling and tinnitus.   Eyes: Negative for photophobia and visual disturbance.  Respiratory: Negative for choking and stridor.   Gastrointestinal: Negative for vomiting and blood in stool.  Genitourinary: Negative for hematuria and decreased urine volume.  Musculoskeletal: Negative for gait problem.  Skin: Negative for color change and wound.  Neurological:  Negative for tremors and numbness.      Objective:   Physical Exam BP 140/82  Pulse 78  Temp(Src) 97.6 F (36.4 C) (Oral)  Ht 5\' 5"  (1.651 m)  Wt 190 lb 12.8 oz (86.546 kg)  BMI 31.75 kg/m2  SpO2 97% Physical Exam  VS noted Constitutional: Pt appears well-developed and well-nourished.  HENT: Head: Normocephalic.  Right Ear: External ear normal.  Left Ear: External ear normal.  Eyes: Conjunctivae and EOM are normal. Pupils are equal, round, and reactive to light.  Neck: Normal range of motion. Neck supple.  Cardiovascular: Normal rate and regular rhythm.   Pulmonary/Chest: Effort normal and breath sounds normal.  Abd:  Soft, NT, non-distended, + BS Neurological: Pt is alert. No cranial nerve deficit. Motor/sens/dtr intact throughout, gait normal  Skin: Skin is warm. No erythema.  Psychiatric: Pt behavior is normal. Thought content normal.  MSK :  Diffuse mild to mod tenderness to left paracervical, Left SCM distribution, left trapezoid and left anterior shoudler which has FROM       Assessment & Plan:

## 2011-03-22 NOTE — Assessment & Plan Note (Signed)
Pt states under stress recently; especaily at work,  Trying to lose wt; low salt diet,  to f/u any worsening symptoms or concerns  BP Readings from Last 3 Encounters:  03/22/11 140/82  10/10/10 110/80  11/08/09 120/84

## 2011-03-22 NOTE — Assessment & Plan Note (Addendum)
Mild elevated, pt agrees to follow lower chol diet, declines statin for now  Last ldlL  130  (mar 2012)

## 2011-03-28 ENCOUNTER — Telehealth: Payer: Self-pay

## 2011-03-28 DIAGNOSIS — G40909 Epilepsy, unspecified, not intractable, without status epilepticus: Secondary | ICD-10-CM

## 2011-03-28 NOTE — Telephone Encounter (Signed)
Please let pt know that [facility] has a new neurologist, Dr Modesto Charon  If he has already been seen per [facility], he should be referred back there, but would he want to see Dr Modesto Charon instead?

## 2011-03-28 NOTE — Telephone Encounter (Signed)
Pt states she would prefer to see MD at [facility] "I need to see someone who speaks english".

## 2011-03-28 NOTE — Telephone Encounter (Signed)
Pt called requesting referral to [facility] for seizure disorder

## 2011-03-28 NOTE — Telephone Encounter (Signed)
Dr Modesto Charon does speak english, but will defer to pt

## 2011-05-04 ENCOUNTER — Encounter: Payer: Self-pay | Admitting: *Deleted

## 2011-05-09 LAB — I-STAT 8, (EC8 V) (CONVERTED LAB)
BUN: 12
Bicarbonate: 21.6
Glucose, Bld: 110 — ABNORMAL HIGH
Hemoglobin: 15.6
Sodium: 137
TCO2: 23
pH, Ven: 7.478 — ABNORMAL HIGH

## 2011-05-09 LAB — RAPID URINE DRUG SCREEN, HOSP PERFORMED
Amphetamines: NOT DETECTED
Barbiturates: NOT DETECTED
Opiates: NOT DETECTED
Tetrahydrocannabinol: NOT DETECTED

## 2011-05-09 LAB — CBC
HCT: 43.3
Hemoglobin: 14.7
MCHC: 33.9
RBC: 5.01
RDW: 12.8

## 2011-05-09 LAB — URINE MICROSCOPIC-ADD ON

## 2011-05-09 LAB — DIFFERENTIAL
Basophils Relative: 0
Eosinophils Relative: 1
Lymphocytes Relative: 17
Monocytes Absolute: 0.5
Monocytes Relative: 7
Neutro Abs: 6.2

## 2011-05-09 LAB — URINALYSIS, ROUTINE W REFLEX MICROSCOPIC
Bilirubin Urine: NEGATIVE
Ketones, ur: NEGATIVE
Specific Gravity, Urine: 1.019
pH: 5.5

## 2011-05-09 LAB — POCT I-STAT CREATININE: Operator id: 270651

## 2011-05-12 ENCOUNTER — Encounter: Payer: Self-pay | Admitting: Gastroenterology

## 2011-05-12 ENCOUNTER — Ambulatory Visit (INDEPENDENT_AMBULATORY_CARE_PROVIDER_SITE_OTHER): Payer: 59 | Admitting: Gastroenterology

## 2011-05-12 DIAGNOSIS — K589 Irritable bowel syndrome without diarrhea: Secondary | ICD-10-CM

## 2011-05-12 DIAGNOSIS — K219 Gastro-esophageal reflux disease without esophagitis: Secondary | ICD-10-CM

## 2011-05-12 DIAGNOSIS — K649 Unspecified hemorrhoids: Secondary | ICD-10-CM

## 2011-05-12 MED ORDER — HYDROCORTISONE ACE-PRAMOXINE 2.5-1 % RE CREA: TOPICAL_CREAM | Freq: Two times a day (BID) | RECTAL | Status: AC | PRN

## 2011-05-12 MED ORDER — DEXLANSOPRAZOLE 60 MG PO CPDR: 60.0000 mg | DELAYED_RELEASE_CAPSULE | Freq: Every day | ORAL | Status: DC

## 2011-05-12 NOTE — Patient Instructions (Signed)
Your physician has requested that you go to the basement for the following lab work before leaving today: IFOB We have sent the following medications to your pharmacy for you to pick up at your convenience: Analpram We have sent Dexilant to [facility].

## 2011-05-12 NOTE — Progress Notes (Signed)
This is a [age]-year-old Caucasian male with chronic acid reflux well controlled on daily Dexilant 60 mg. Endoscopy was performed 2 years ago, and there was no evidence of Barrett's mucosa. Colonoscopy was by Dr. Madilyn Fireman in 2006. The patient does have some mild bowel irregularity and recurrent hemorrhoidal itching, but but he denies rectal bleeding or significant abdominal pain. His appetite is good his weight is stable. He currently denies chest pain, dysphagia, or any hepatobiliary complaints. Previous upper abdominal ultrasound exam was unremarkable. Review of his labs shows no specific abnormalities.  Current Medications, Allergies, Past Medical History, Past Surgical History, Family History and Social History were reviewed in [employer] record.  Pertinent Review of Systems Negative   Physical Exam: Awake and alert appearing his stated age. I cannot appreciate stigmata of chronic liver disease. Chest is clear, cardiac exam is normal. There is no organomegaly, or abdominal masses, tenderness, and bowel sounds are normal. Peripheral extremities unremarkable her mental status is normal. Inspection of the rectum shows some external hemorrhoidal tissue without fissures or fistulae.    Assessment and Plan: We'll controlled GERD on daily PPI therapy. We have her new Dexilant, and have prescribed when necessary Analpram cream locally for his hemorrhoidal swelling. I have asked him to continue a high fiber diet and to return IFOB stool cards. He is continues primary care followup and labs with Dr. Oliver Barre in February Encounter Diagnoses  Name Primary?  . GERD (gastroesophageal reflux disease)   . Hemorrhoid   . IBS (irritable bowel syndrome)

## 2011-06-02 ENCOUNTER — Other Ambulatory Visit: Payer: 59

## 2011-06-06 ENCOUNTER — Emergency Department (HOSPITAL_COMMUNITY)
Admission: EM | Admit: 2011-06-06 | Discharge: 2011-06-06 | Disposition: A | Discharge status: Home / Self Care | Payer: 59 | Attending: Emergency Medicine | Admitting: Emergency Medicine

## 2011-06-06 DIAGNOSIS — R4182 Altered mental status, unspecified: Secondary | ICD-10-CM | POA: Insufficient documentation

## 2011-06-06 DIAGNOSIS — R5381 Other malaise: Secondary | ICD-10-CM | POA: Insufficient documentation

## 2011-06-06 DIAGNOSIS — R5383 Other fatigue: Secondary | ICD-10-CM | POA: Insufficient documentation

## 2011-06-06 DIAGNOSIS — R569 Unspecified convulsions: Secondary | ICD-10-CM | POA: Insufficient documentation

## 2011-06-06 DIAGNOSIS — M542 Cervicalgia: Secondary | ICD-10-CM | POA: Insufficient documentation

## 2011-06-06 NOTE — ED Provider Notes (Signed)
History     CSN: [id number] Arrival date & time: 06/06/2011  3:28 PM   First MD Initiated Contact with Patient 06/06/11 1548      Chief Complaint  Patient presents with  . Altered Mental Status    (Consider location/radiation/quality/duration/timing/severity/associated sxs/prior treatment) Patient is a 55 y.o. Moran presenting with altered mental status. The history is provided by the patient.  Altered Mental Status  The patient has a known seizure disorder. He thinks he had one of his "mini seizures" today. His first seizure was in 2008 and was a generalized tonic-clonic seizure. Since then he has what he calls these "mini seizures". Today he was talking on the telephone and the next thing he knew, at his boss was talking to him while he was laying down. At this point, he is feeling weak,, and he is also complaining of some aching in his neck. This is typical of his mini seizures. He denies urinary or fecal incontinence and denies but bit lip or bit tongue. He is on Keppra and claims compliance with the medication. Episode is moderate in severity. His roommate is here, and states that he is back to his baseline mental status.  No past medical history on file.  No past surgical history on file.  No family history on file.  History  Substance Use Topics  . Smoking status: Not on file  . Smokeless tobacco: Not on file  . Alcohol Use: Not on file      Review of Systems  Psychiatric/Behavioral: Positive for altered mental status.  All other systems reviewed and are negative.    Allergies  Review of patient's allergies indicates no known allergies.  Home Medications  No current outpatient prescriptions on file.  BP 128/75  Pulse 71  Temp(Src) 97.3 F (36.3 C) (Oral)  Resp 18  SpO2 94%  Physical Exam  Nursing note and vitals reviewed.  55 year old Moran who is resting comfortably and in no acute distress. Vital signs are normal. Head is normocephalic and atraumatic.  PERRLA, EOMI. Oropharynx is clear. Neck has mild tenderness along the paracervical muscles, but no midline tenderness. There is no cervical adenopathy and no JVD. Back is nontender and there is no CVA tenderness. Lungs are clear without rales wheezes or rhonchi. There is no chest wall tenderness. Heart regular rate and rhythm without murmur. Abdomen is soft flat and nontender without masses or hepatosplenomegaly peristalsis is normal active. Extremities have no cyanosis or edema. Neurologic: He is awake, alert, and oriented x3. Cranial nerves are intact. There are no motor Center deficits. Deep tendon reflexes are symmetric. Psychiatric: No abnormalities of mood or affect.  ED Course  Procedures (including critical care time)    MDM  Seizure in a patient with known seizure disorder. Episode is typical of his seizures. He has returned to his baseline. No further workup is indicated.        Dione Booze, MD 06/06/11 [phone]

## 2011-06-06 NOTE — ED Notes (Signed)
Patient presents to ed states he was at work talking on the phone and felt like his mind went blank.  C/o frontal headache with nausea c/o slight substernal chest pain , upon arrival to ed painfree. States he just feels "out of it". Co-workers states patient  Was acting very anxious. However feels much better now.

## 2011-06-07 ENCOUNTER — Other Ambulatory Visit: Payer: Self-pay | Admitting: Gastroenterology

## 2011-06-07 DIAGNOSIS — Z1211 Encounter for screening for malignant neoplasm of colon: Secondary | ICD-10-CM

## 2011-09-22 ENCOUNTER — Ambulatory Visit: Payer: 59 | Admitting: Internal Medicine

## 2011-10-03 ENCOUNTER — Other Ambulatory Visit: Payer: Self-pay | Admitting: Gastroenterology

## 2011-12-08 ENCOUNTER — Encounter: Payer: Self-pay | Admitting: Internal Medicine

## 2011-12-08 ENCOUNTER — Ambulatory Visit (INDEPENDENT_AMBULATORY_CARE_PROVIDER_SITE_OTHER): Payer: 59 | Admitting: Internal Medicine

## 2011-12-08 VITALS — BP 132/88 | HR 81 | Temp 98.1°F | Wt 193.0 lb

## 2011-12-08 DIAGNOSIS — R030 Elevated blood-pressure reading, without diagnosis of hypertension: Secondary | ICD-10-CM

## 2011-12-08 DIAGNOSIS — B029 Zoster without complications: Secondary | ICD-10-CM

## 2011-12-08 DIAGNOSIS — F411 Generalized anxiety disorder: Secondary | ICD-10-CM

## 2011-12-08 DIAGNOSIS — J019 Acute sinusitis, unspecified: Secondary | ICD-10-CM

## 2011-12-08 MED ORDER — VALACYCLOVIR HCL 1 G PO TABS: 1000.0000 mg | ORAL_TABLET | Freq: Three times a day (TID) | ORAL | Status: AC

## 2011-12-08 MED ORDER — LEVOFLOXACIN 250 MG PO TABS: 250.0000 mg | ORAL_TABLET | Freq: Every day | ORAL | Status: AC

## 2011-12-08 NOTE — Patient Instructions (Addendum)
Take all new medications as prescribed Continue all other medications as before Please return in 1 month for your yearly visit, or sooner if needed, with Lab testing done 3-5 days before  

## 2011-12-09 ENCOUNTER — Encounter: Payer: Self-pay | Admitting: Internal Medicine

## 2011-12-09 NOTE — Assessment & Plan Note (Signed)
stable overall by hx and exam, most recent data reviewed with pt, and pt to continue medical treatment as before Lab Results  Component Value Date   WBC 7.1 10/10/2010   HGB 14.5 10/10/2010   HCT 41.8 10/10/2010   PLT 217.0 10/10/2010   GLUCOSE 87 10/10/2010   CHOL 223* 10/10/2010   TRIG 282.0* 10/10/2010   HDL 36.30* 10/10/2010   LDLDIRECT 130.6 10/10/2010   ALT 41 10/10/2010   AST 29 10/10/2010   NA 136 10/10/2010   K 3.8 10/10/2010   CL 103 10/10/2010   CREATININE 1.2 10/10/2010   BUN 14 10/10/2010   CO2 27 10/10/2010   TSH 1.34 10/10/2010   PSA 1.02 10/10/2010

## 2011-12-09 NOTE — Assessment & Plan Note (Signed)
D/w pt - stable overall by hx and exam, most recent data reviewed with pt, and pt to continue medical treatment as before BP Readings from Last 3 Encounters:  12/08/11 132/88  06/06/11 128/75  05/12/11 136/80

## 2011-12-09 NOTE — Assessment & Plan Note (Signed)
D/w pt, for tylenol prn, also valtrex course, to f/u any worsening symptoms or concerns

## 2011-12-09 NOTE — Assessment & Plan Note (Signed)
Mild to mod, for antibx course,  to f/u any worsening symptoms or concerns 

## 2011-12-09 NOTE — Progress Notes (Signed)
  Subjective:    Patient ID: Tyler Moran, male    DOB: 06-15-1956, 56 y.o.   MRN: [id number]  HPI   Here with 3 days acute onset fever, facial pain, pressure, general weakness and malaise, and greenish d/c, with slight ST, but little to no cough and Pt denies chest pain, increased sob or doe, wheezing, orthopnea, PND, increased LE swelling, palpitations, dizziness or syncope. Also with painful rash to right forehead x 2 days he has not had before.  Denies worsening depressive symptoms, suicidal ideation, or panic, though has ongoing anxiety, not increased recently.  Pt denies chest pain, increased sob or doe, wheezing, orthopnea, PND, increased LE swelling, palpitations, dizziness or syncope.  Pt denies polydipsia, polyuria.   Past Medical History  Diagnosis Date  . Hyperlipidemia 03/22/2011  . Nephrolithiasis   . Seizure disorder   . Bilateral hearing loss   . GERD (gastroesophageal reflux disease)   . Anxiety   . Hemorrhoids   . Insomnia   . Gastritis   . Fatty liver   . Diverticulosis    Past Surgical History  Procedure Date  . Eardrum surgery     bilateral    reports that he has never smoked. He has never used smokeless tobacco. He reports that he does not drink alcohol or use illicit drugs. family history includes Breast cancer in his mother; Diabetes in his brother; Heart attack in his father; and Stroke in his father and mother.  There is no history of Colon cancer. No Known Allergies Current Outpatient Prescriptions on File Prior to Visit  Medication Sig Dispense Refill  . DEXILANT 60 MG capsule TAKE 1 CAPSULE DAILY  90 capsule  2  . levETIRAcetam (KEPPRA) 500 MG tablet Take 1,500 mg by mouth every 12 (twelve) hours.         Review of Systems Review of Systems  Constitutional: Negative for diaphoresis and unexpected weight change.  HENT: Negative for drooling and tinnitus.   Eyes: Negative for photophobia and visual disturbance.  Respiratory: Negative for choking and  stridor.   Gastrointestinal: Negative for vomiting and blood in stool.  Genitourinary: Negative for hematuria and decreased urine volume.  Neurological: Negative for tremors and numbness.  Psychiatric/Behavioral: Negative for decreased concentration. The patient is not hyperactive.       Objective:   Physical Exam BP 132/88  Pulse 81  Temp(Src) 98.1 F (36.7 C) (Oral)  Wt 193 lb (87.544 kg)  SpO2 94% Physical Exam  VS noted, mild ill Constitutional: Pt appears well-developed and well-nourished.  HENT: Head: Normocephalic.  Right Ear: External ear normal.  Left Ear: External ear normal.  Bilat tm's mild erythema.  Sinus tender.  Pharynx mild erythema Eyes: Conjunctivae and EOM are normal. Pupils are equal, round, and reactive to light.  Neck: Normal range of motion. Neck supple.  Cardiovascular: Normal rate and regular rhythm.   Pulmonary/Chest: Effort normal and breath sounds normal.  Neurological: Pt is alert. No cranial nerve deficit.  Skin: Skin is warm. No erythema. except for typical mult areas grouped vesicles on erythem base to right upper third area 5th nerve with some swelling/tender preauricular area as well Psychiatric: Pt behavior is normal. Thought content normal. 1+ nervous    Assessment & Plan:

## 2011-12-21 ENCOUNTER — Telehealth: Payer: Self-pay

## 2011-12-21 MED ORDER — AZITHROMYCIN 250 MG PO TABS: ORAL_TABLET | ORAL | Status: AC

## 2011-12-21 NOTE — Telephone Encounter (Signed)
Pt called requesting refill of Levaquin. Pt is c/o mild ST, HA, green nasal drainage and fatigue even after completing ABX course.

## 2011-12-21 NOTE — Telephone Encounter (Signed)
Ok to try change to zpack course - done per emr

## 2011-12-21 NOTE — Telephone Encounter (Signed)
Called the patient informed zpack sent in to pharmacy

## 2012-01-09 ENCOUNTER — Telehealth: Payer: Self-pay | Admitting: Internal Medicine

## 2012-01-09 NOTE — Telephone Encounter (Signed)
ENCOUNTER OPENED IN ERROR

## 2012-01-18 ENCOUNTER — Telehealth: Payer: Self-pay

## 2012-01-18 NOTE — Telephone Encounter (Signed)
Pt called stating that he was Dx with Shingles 11/2011. He will be spending time with an infant next week and is unsure if he was advised by MD that he could spread varicella virus to unvaccinated individuals, please advise.

## 2012-01-18 NOTE — Telephone Encounter (Signed)
Called the patient left message to call back 

## 2012-01-18 NOTE — Telephone Encounter (Signed)
Ok to be around infants if have been vaccinated, or after lesions have crusted over (usually 7-10 days)

## 2012-01-18 NOTE — Telephone Encounter (Signed)
Called the left message to call back 

## 2012-01-19 NOTE — Telephone Encounter (Signed)
Called the patient left message to call back 

## 2012-01-19 NOTE — Telephone Encounter (Signed)
Called the patient and still unable to speak to the patient.   Left detailed message of MD's instructions.

## 2012-10-23 ENCOUNTER — Other Ambulatory Visit: Payer: Self-pay | Admitting: Internal Medicine

## 2012-12-10 ENCOUNTER — Encounter: Payer: 59 | Admitting: Internal Medicine

## 2013-01-21 ENCOUNTER — Telehealth: Payer: Self-pay | Admitting: Gastroenterology

## 2013-01-21 MED ORDER — DEXLANSOPRAZOLE 60 MG PO CPDR: 60.0000 mg | DELAYED_RELEASE_CAPSULE | Freq: Every day | ORAL | Status: DC

## 2013-01-21 NOTE — Telephone Encounter (Signed)
Rx sent 

## 2013-02-05 ENCOUNTER — Ambulatory Visit: Payer: 59 | Admitting: Gastroenterology

## 2013-02-17 ENCOUNTER — Encounter: Payer: Self-pay | Admitting: Gastroenterology

## 2013-02-17 ENCOUNTER — Ambulatory Visit (INDEPENDENT_AMBULATORY_CARE_PROVIDER_SITE_OTHER): Payer: 59 | Admitting: Gastroenterology

## 2013-02-17 VITALS — BP 130/90 | HR 78 | Ht 65.0 in | Wt 192.2 lb

## 2013-02-17 DIAGNOSIS — R21 Rash and other nonspecific skin eruption: Secondary | ICD-10-CM

## 2013-02-17 DIAGNOSIS — L290 Pruritus ani: Secondary | ICD-10-CM

## 2013-02-17 DIAGNOSIS — K219 Gastro-esophageal reflux disease without esophagitis: Secondary | ICD-10-CM

## 2013-02-17 DIAGNOSIS — R1310 Dysphagia, unspecified: Secondary | ICD-10-CM

## 2013-02-17 MED ORDER — NYSTATIN-TRIAMCINOLONE 100000-0.1 UNIT/GM-% EX OINT: TOPICAL_OINTMENT | Freq: Three times a day (TID) | CUTANEOUS | Status: DC

## 2013-02-17 MED ORDER — MOVIPREP 100 G PO SOLR: 1.0000 | Freq: Once | ORAL | Status: DC

## 2013-02-17 MED ORDER — DEXLANSOPRAZOLE 60 MG PO CPDR: 60.0000 mg | DELAYED_RELEASE_CAPSULE | Freq: Every day | ORAL | Status: DC

## 2013-02-17 NOTE — Progress Notes (Signed)
This is a 57 year old Caucasian male who has chronic GERD with last endoscopy several years ago.  He discontinued his PPI therapy 6-8 months ago as had recurrence of his subxiphoid discomfort with some intermittent solid food dysphagia.  She's been taking over-the-counter ranitidine with mild success but continues with sudden xiphoid pain.  His other problem is rectal pruritus , and he is been on Diflucan daily for 3 weeks per primary care.  He also has been using local steroid creams to his perianal area, and continues with itching, but denies bowel or regularity, rectal bleeding, or abdominal pain.  His last colonoscopy was almost 10 years ago.  Patient denies a specific food intolerances, hepatobiliary or other general medical problems.  He is on Keppra  1.5 g twice a day for seizure disorder.  Current Medications, Allergies, Past Medical History, Past Surgical History, Family History and Social History were reviewed in [employer] record.  ROS: All systems were reviewed and are negative unless otherwise stated in the HPI.          Physical Exam: Blood pressure 130/90, pulse 78, and weight 192 with a BMI of 31.98.  Cannot appreciate stigmata of chronic liver disease.  Chest is clear he is in a regular rhythm without murmurs gallops or rubs.  His abdomen shows some sub-xiphoid tenderness but otherwise is unremarkable without organomegaly, masses or tenderness.  Inspection the rectum does show perianal erythema but no deep fissuring, fistula, or weeping lesions.  Rectal exam shows some tenderness but no masses or area of perianal abscess.  There was no stool for guaiac testing.  Peripheral extremities are unremarkable and mental status is normal.   Assessment and Plan: His GERD has returned off PPI therapy, and have restarted Dexilant 60 mg a day.  Because of his dysphagia, we'll proceed with endoscopy and dilation.  He needs followup colonoscopy for colon cancer screening, and  for his perianal rash, I prescribed Mycolog cream 3 times a day, fissures or Diflucan, and stop local steroid creams to his rectum.

## 2013-02-17 NOTE — Patient Instructions (Addendum)
  You have been scheduled for an endoscopy and colonoscopy with propofol. Please follow the written instructions given to you at your visit today. Please pick up your prep at the pharmacy within the next 1-3 days. If you use inhalers (even only as needed), please bring them with you on the day of your procedure. Your physician has requested that you go to www.startemmi.com and enter the access code given to you at your visit today. This web site gives a general overview about your procedure. However, you should still follow specific instructions given to you by our office regarding your preparation for the procedure.  We have sent the following medications to your pharmacy for you to pick up at your convenience: Dexilant 60 mg, please take one capsule by mouth once daily  Mycolog Ointment, apply to rectum three times daily  Stop Proctosol _________________________________________________________________________________                                               We are excited to introduce MyChart, a new best-in-class service that provides you online access to important information in your electronic medical record. We want to make it easier for you to view your health information - all in one secure location - when and where you need it. We expect MyChart will enhance the quality of care and service we provide.  When you register for MyChart, you can:    View your test results.    Request appointments and receive appointment reminders via email.    Request medication renewals.    View your medical history, allergies, medications and immunizations.    Communicate with your physician's office through a password-protected site.    Conveniently print information such as your medication lists.  To find out if MyChart is right for you, please talk to a member of our clinical staff today. We will gladly answer your questions about this free health and wellness tool.  If you are age 58 or  older and want a member of your family to have access to your record, you must provide written consent by completing a proxy form available at our office. Please speak to our clinical staff about guidelines regarding accounts for patients younger than age 58.  As you activate your MyChart account and need any technical assistance, please call the MyChart technical support line at [phone]) or email your question to [email or web address]. If you email your question(s), please include your name, a return phone number and the best time to reach you.  If you have non-urgent health-related questions, you can send a message to our office through MyChart at [email or web address]. If you have a medical emergency, call 911.  Thank you for using MyChart as your new health and wellness resource!   MyChart licensed from [employer],  [id number]. Patents Pending.

## 2013-02-24 ENCOUNTER — Ambulatory Visit (AMBULATORY_SURGERY_CENTER): Payer: 59 | Admitting: Gastroenterology

## 2013-02-24 ENCOUNTER — Telehealth: Payer: Self-pay | Admitting: *Deleted

## 2013-02-24 ENCOUNTER — Encounter: Payer: Self-pay | Admitting: Gastroenterology

## 2013-02-24 VITALS — BP 152/76 | HR 64 | Temp 99.1°F | Resp 15 | Ht 65.0 in | Wt 192.0 lb

## 2013-02-24 DIAGNOSIS — K317 Polyp of stomach and duodenum: Secondary | ICD-10-CM

## 2013-02-24 DIAGNOSIS — Z1211 Encounter for screening for malignant neoplasm of colon: Secondary | ICD-10-CM

## 2013-02-24 DIAGNOSIS — D126 Benign neoplasm of colon, unspecified: Secondary | ICD-10-CM

## 2013-02-24 DIAGNOSIS — K2940 Chronic atrophic gastritis without bleeding: Secondary | ICD-10-CM

## 2013-02-24 DIAGNOSIS — D131 Benign neoplasm of stomach: Secondary | ICD-10-CM

## 2013-02-24 DIAGNOSIS — K219 Gastro-esophageal reflux disease without esophagitis: Secondary | ICD-10-CM

## 2013-02-24 DIAGNOSIS — R1310 Dysphagia, unspecified: Secondary | ICD-10-CM

## 2013-02-24 DIAGNOSIS — K5730 Diverticulosis of large intestine without perforation or abscess without bleeding: Secondary | ICD-10-CM

## 2013-02-24 DIAGNOSIS — R1013 Epigastric pain: Secondary | ICD-10-CM

## 2013-02-24 MED ORDER — SODIUM CHLORIDE 0.9 % IV SOLN: 500.0000 mL | INTRAVENOUS | Status: DC

## 2013-02-24 MED ORDER — DEXLANSOPRAZOLE 60 MG PO CPDR: 60.0000 mg | DELAYED_RELEASE_CAPSULE | Freq: Every day | ORAL | Status: DC

## 2013-02-24 NOTE — Progress Notes (Signed)
Patient requested for his medication prescribed on last office visit be sent to [facility]. Ok Anis CMA notified by Milford Cage CMA. 15 NEXIUM SAMPLES ALSO PROVIDED BY KELLY SMITH CMA.

## 2013-02-24 NOTE — Telephone Encounter (Signed)
Patient was up stairs having a procedure and told Britta Mccreedy he requested Dexilant to the be refilled at the wrong pharmacy Britta Mccreedy called me and said RX should be sent to [facility] I gave samples and sent new RX

## 2013-02-24 NOTE — Patient Instructions (Addendum)
Discharge instructions given with verbal understanding. Handouts on polyps,diverticulosis and a dilatation diet given. Resume previous medications. YOU HAD AN ENDOSCOPIC PROCEDURE [facility]: Refer to the procedure report that was given to you for any specific questions about what was found during the examination.  If the procedure report does not answer your questions, please call your gastroenterologist to clarify.  If you requested that your care partner not be given the details of your procedure findings, then the procedure report has been included in a sealed envelope for you to review at your convenience later.  YOU SHOULD EXPECT: Some feelings of bloating in the abdomen. Passage of more gas than usual.  Walking can help get rid of the air that was put into your GI tract during the procedure and reduce the bloating. If you had a lower endoscopy (such as a colonoscopy or flexible sigmoidoscopy) you may notice spotting of blood in your stool or on the toilet paper. If you underwent a bowel prep for your procedure, then you may not have a normal bowel movement for a few days.  DIET: Your first meal following the procedure should be a light meal and then it is ok to progress to your normal diet.  A half-sandwich or bowl of soup is an example of a good first meal.  Heavy or fried foods are harder to digest and may make you feel nauseous or bloated.  Likewise meals heavy in dairy and vegetables can cause extra gas to form and this can also increase the bloating.  Drink plenty of fluids but you should avoid alcoholic beverages for 24 hours.  ACTIVITY: Your care partner should take you home directly after the procedure.  You should plan to take it easy, moving slowly for the rest of the day.  You can resume normal activity the day after the procedure however you should NOT DRIVE or use heavy machinery for 24 hours (because of the sedation medicines used during the test).     SYMPTOMS TO REPORT IMMEDIATELY: A gastroenterologist can be reached at any hour.  During normal business hours, 8:30 AM to 5:00 PM Monday through Friday, call [phone].  After hours and on weekends, please call the GI answering service at [phone] who will take a message and have the physician on call contact you.   Following lower endoscopy (colonoscopy or flexible sigmoidoscopy):  Excessive amounts of blood in the stool  Significant tenderness or worsening of abdominal pains  Swelling of the abdomen that is new, acute  Fever of 100F or higher  Following upper endoscopy (EGD)  Vomiting of blood or coffee ground material  New chest pain or pain under the shoulder blades  Painful or persistently difficult swallowing  New shortness of breath  Fever of 100F or higher  Black, tarry-looking stools  FOLLOW UP: If any biopsies were taken you will be contacted by phone or by letter within the next 1-3 weeks.  Call your gastroenterologist if you have not heard about the biopsies in 3 weeks.  Our staff will call the home number listed on your records the next business day following your procedure to check on you and address any questions or concerns that you may have at that time regarding the information given to you following your procedure. This is a courtesy call and so if there is no answer at the home number and we have not heard from you through the emergency physician on call, we will assume  that you have returned to your regular daily activities without incident.  SIGNATURES/CONFIDENTIALITY: You and/or your care partner have signed paperwork which will be entered into your electronic medical record.  These signatures attest to the fact that that the information above on your After Visit Summary has been reviewed and is understood.  Full responsibility of the confidentiality of this discharge information lies with you and/or your care-partner.

## 2013-02-24 NOTE — Op Note (Signed)
[facility] [address] [place], [address]   ENDOSCOPY PROCEDURE REPORT  PATIENT: Tyler Moran, Tyler Moran  MR#: [id number] BIRTHDATE: [birth year] , 57  yrs. old GENDER: Male ENDOSCOPIST:David Hale Bogus, MD, [facility] REFERRED BY: PROCEDURE DATE:  02/24/2013 PROCEDURE:   EGD w/ biopsy and Maloney dilation of esophagus ASA CLASS:    Class II INDICATIONS: Dyspepsia and Dysphagia. MEDICATION: There was residual sedation effect present from prior procedure and propofol (Diprivan) 150mg  IV TOPICAL ANESTHETIC:  DESCRIPTION OF PROCEDURE:   After the risks and benefits of the procedure were explained, informed consent was obtained.  The LB [id number]  endoscope was introduced through the mouth  and advanced to the second portion of the duodenum .  The instrument was slowly withdrawn as the mucosa was fully examined.      DUODENUM: The duodenal mucosa showed no abnormalities in the bulb and second portion of the duodenum.  STOMACH: The mucosa of the stomach appeared normal.  Scattered hyperplastic polyps biopsied and mucosa biopsied  ESOPHAGUS: The mucosa of the esophagus appeared normal. There was no esophagitis, esophageal stricture, but there was a small hiatal hernia.   Retroflexed views revealed a hiatal hernia.    The scope was then withdrawn from the patient and the procedure completed.the patient was then dilated with a #56 [place] dilator.  There was no pain with this bougie passage or any evidence of bleeding.  COMPLICATIONS: There were no complications.   ENDOSCOPIC IMPRESSION: 1.   The duodenal mucosa showed no abnormalities in the bulb and second portion of the duodenum 2.   The mucosa of the stomach appeared normal ...hyperplastic polyps seen with PPI use. 3.   The mucosa of the esophagus appeared normal..?? occult stricture dilated.  RECOMMENDATIONS: 1.  Await biopsy results 2.  Continue PPI 3. Esophageal manometry if dysphagia  persists.    _______________________________ eSignedMardella Layman, MD, [facility] 02/24/2013 2:28 PM  cc  Dr. Drue Novel standard discharge

## 2013-02-24 NOTE — Progress Notes (Signed)
Called to room to assist during endoscopic procedure.  Patient ID and intended procedure confirmed with present staff. Received instructions for my participation in the procedure from the performing physician.  

## 2013-02-24 NOTE — Progress Notes (Signed)
Patient did not experience any of the following events: a burn prior to discharge; a fall within the facility; wrong site/side/patient/procedure/implant event; or a hospital transfer or hospital admission upon discharge from the facility. (G8907) Patient did not have preoperative order for IV antibiotic SSI prophylaxis. (G8918)  

## 2013-02-24 NOTE — Op Note (Signed)
[facility] [address] [place], [address]   COLONOSCOPY PROCEDURE REPORT  PATIENT: Tyler Moran, Tyler Moran  MR#: [id number] BIRTHDATE: [birth year] , 57  yrs. old GENDER: Male ENDOSCOPIST: Mardella Layman, MD, [facility] REFERRED BY: PROCEDURE DATE:  02/24/2013 PROCEDURE:   Colonoscopy with snare polypectomy First Screening Colonoscopy - Avg.  risk and is 50 yrs.  old or older - No.  Prior Negative Screening - Now for repeat screening. Less than 10 yrs  History of Adenoma - Now for follow-up colonoscopy & has been > or = to 3 yrs.  N/A  Polyps Removed Today? Yes. ASA CLASS:   Class II INDICATIONS:average risk screening. MEDICATIONS: Propofol (Diprivan) and propofol (Diprivan) 250mg  IV  DESCRIPTION OF PROCEDURE:   After the risks benefits and alternatives of the procedure were thoroughly explained, informed consent was obtained.  A digital rectal exam revealed no abnormalities of the rectum.   The LB [id number]  endoscope was introduced through the anus and advanced to the cecum, which was identified by both the appendix and ileocecal valve. No adverse events experienced.   The quality of the prep was excellent, using MoviPrep  The instrument was then slowly withdrawn as the colon was fully examined.      COLON FINDINGS: A smooth sessile polyp ranging between 3-40mm in size was found at the hepatic flexure.  Multiple biopsies were performed using cold forceps.   A polypoid shaped sessile polyp ranging between 3-26mm in size was found in the sigmoid colon.  A polypectomy was performed using snare cautery.  The resection was complete and the polyp tissue was completely retrieved.   Mild diverticulosis was noted in the sigmoid colon and descending colon. Retroflexed views revealed no abnormalities. The time to cecum=5 minutes 09 seconds.  Withdrawal time=7 minutes 24 seconds.  The scope was withdrawn and the procedure completed. COMPLICATIONS: There were no  complications.  ENDOSCOPIC IMPRESSION: 1.   Sessile polyp ranging between 3-15mm in size was found at the hepatic flexure; multiple biopsies were performed using cold forceps 2.   Sessile polyp ranging between 3-34mm in size was found in the sigmoid colon; polypectomy was performed using snare cautery 3.   Mild diverticulosis was noted in the sigmoid colon and descending colon  RECOMMENDATIONS: 1.  Await pathology results 2.  Repeat colonoscopy in 5 years if polyp adenomatous; otherwise 10 years 3.  High fiber diet   eSigned:  Mardella Layman, MD, [facility] 02/24/2013 2:22 PM   cc:

## 2013-02-25 ENCOUNTER — Telehealth: Payer: Self-pay

## 2013-02-25 LAB — HELICOBACTER PYLORI SCREEN-BIOPSY: UREASE: NEGATIVE

## 2013-02-25 NOTE — Telephone Encounter (Signed)
Left message to call [facility] if problems post procedure

## 2013-02-26 ENCOUNTER — Encounter: Payer: Self-pay | Admitting: Gastroenterology

## 2013-02-28 ENCOUNTER — Encounter: Payer: Self-pay | Admitting: Gastroenterology

## 2013-05-10 IMAGING — CR DG CHEST 2V
2 series · 2 of 2 positions shown · non-contrast
Comparison: None.

CLINICAL DATA: Left upper chest pain

CHEST - 2 VIEW

[view not recorded (1 of 2)]
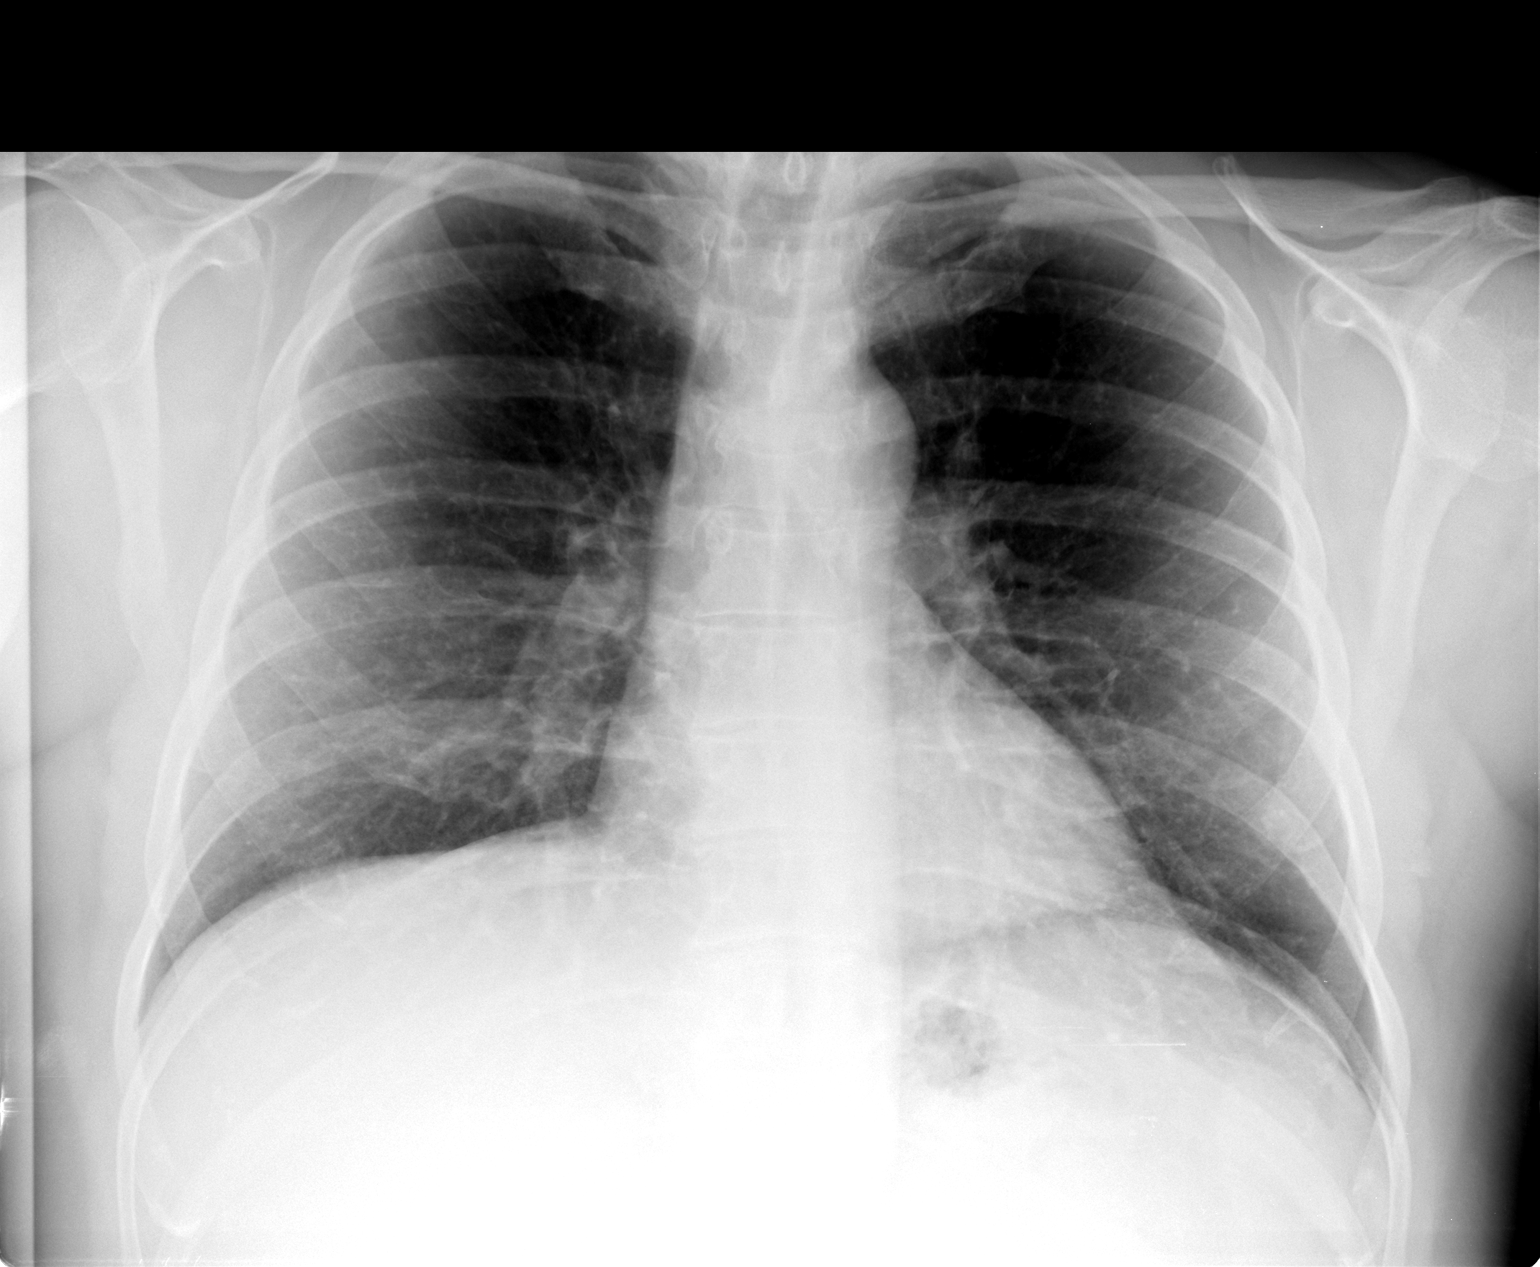

[view not recorded (2 of 2)]
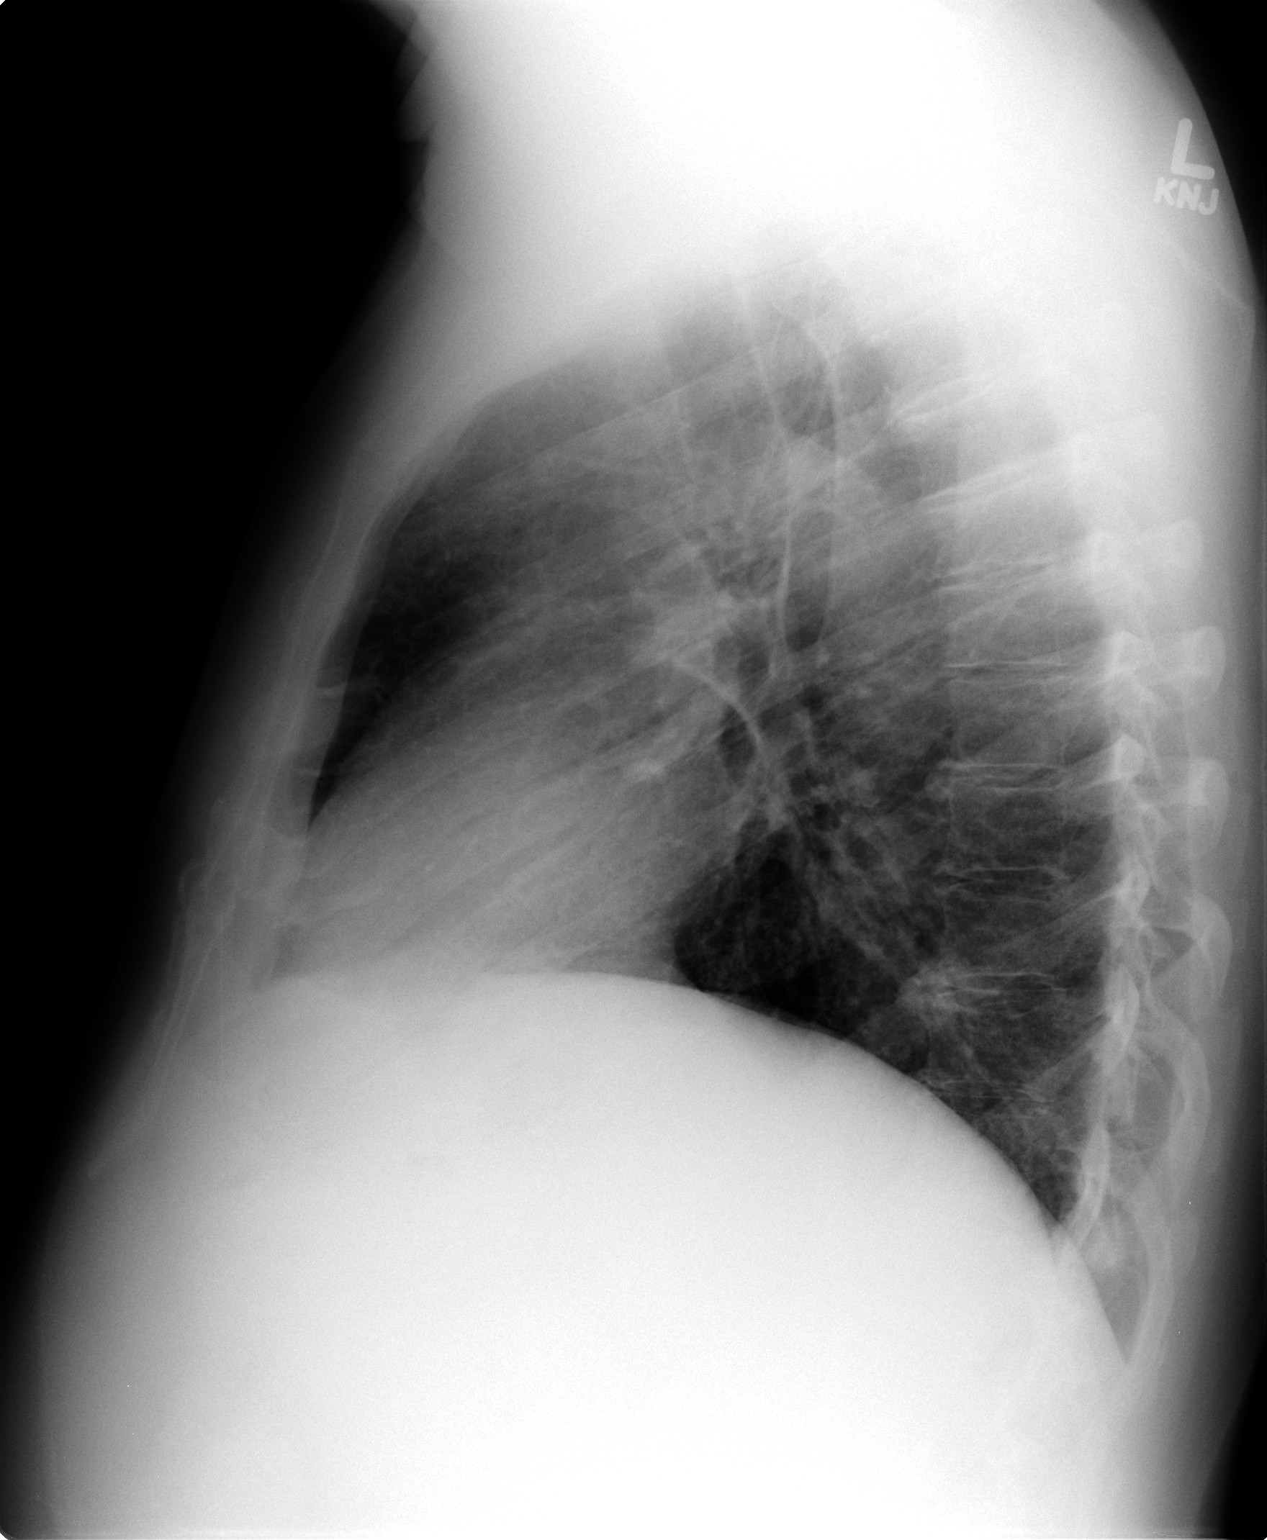

[2 of 2 positions shown; findings below may reference images not displayed]

FINDINGS: Heart size is normal and the vascularity is normal.
Lungs are clear without infiltrate or effusion.
IMPRESSION: No active cardiopulmonary disease.

## 2013-07-05 ENCOUNTER — Other Ambulatory Visit: Payer: Self-pay | Admitting: Gastroenterology

## 2013-12-04 ENCOUNTER — Other Ambulatory Visit: Payer: Self-pay | Admitting: Internal Medicine

## 2013-12-23 ENCOUNTER — Other Ambulatory Visit: Payer: Self-pay | Admitting: Gastroenterology

## 2014-06-16 ENCOUNTER — Ambulatory Visit: Payer: 59 | Admitting: Internal Medicine

## 2015-02-09 ENCOUNTER — Ambulatory Visit: Payer: Self-pay | Admitting: Internal Medicine

## 2015-11-17 ENCOUNTER — Encounter: Payer: Self-pay | Admitting: Gastroenterology

## 2016-02-09 ENCOUNTER — Encounter: Payer: Self-pay | Admitting: Internal Medicine

## 2016-07-26 ENCOUNTER — Other Ambulatory Visit: Payer: Self-pay | Admitting: Family Medicine

## 2016-07-26 DIAGNOSIS — R109 Unspecified abdominal pain: Secondary | ICD-10-CM

## 2016-08-02 ENCOUNTER — Ambulatory Visit
Admission: RE | Admit: 2016-08-02 | Discharge: 2016-08-02 | Disposition: A | Discharge status: Home / Self Care | Payer: BLUE CROSS/BLUE SHIELD | Source: Ambulatory Visit | Attending: Family Medicine | Admitting: Family Medicine

## 2016-08-02 DIAGNOSIS — R109 Unspecified abdominal pain: Secondary | ICD-10-CM

## 2016-08-02 MED ORDER — IOPAMIDOL (ISOVUE-300) INJECTION 61%
100.0000 mL | Freq: Once | INTRAVENOUS | Status: AC | PRN
  Administered 2016-08-02: 100 mL via INTRAVENOUS

## 2018-07-05 ENCOUNTER — Encounter: Payer: Self-pay | Admitting: *Deleted

## 2018-09-14 ENCOUNTER — Encounter: Payer: Self-pay | Admitting: Internal Medicine

## 2018-09-21 IMAGING — CT CT ABD-PELV W/ CM
2 of 5 series · 15 of 46 positions shown, 17 images · IV contrast (iopamidol)
Comparison: CT abdomen and pelvis of 05/06/2007

CLINICAL DATA: Left lower quadrant abdominal pain radiating to the
back for 3 months

EXAM:
CT ABDOMEN AND PELVIS WITH CONTRAST
TECHNIQUE: Multidetector CT imaging of the abdomen and pelvis was performed
using the standard protocol following bolus administration of
intravenous contrast.
CONTRAST:  100mL [id number] IOPAMIDOL (YCKB03-B77) INJECTION 61%
Creatinine was obtained on site at [HOSPITAL] at [HOSPITAL].Results: Creatinine 1.2 mg/dL. GFR of 65.

[Series 2: abd/pelvis w/cm · axial · 0.78mm/px · z∈[-577,-132]mm · 12 of 101 slices shown, 14 images]
[im 6/101  soft-tissue]
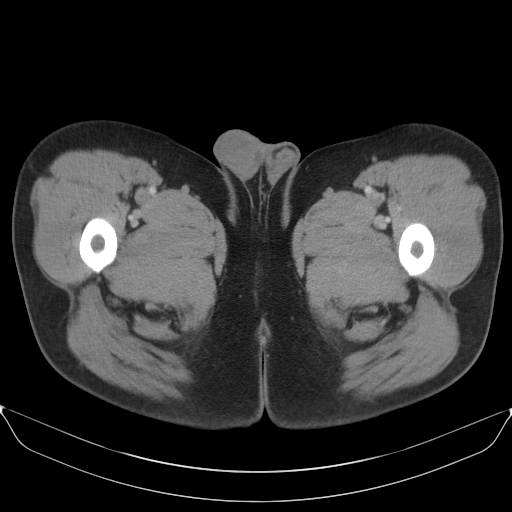
[im 6/101  bone]
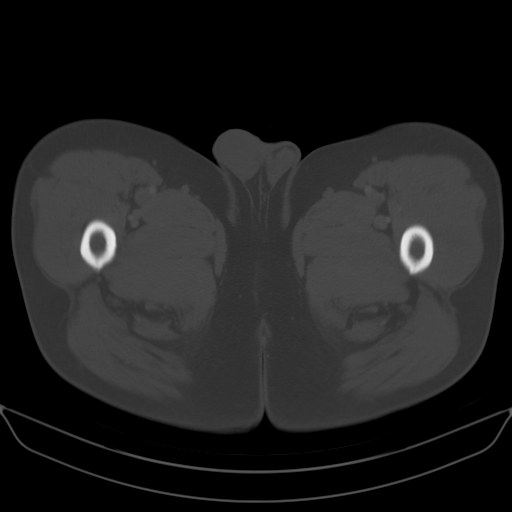
[im 16/101  soft-tissue]
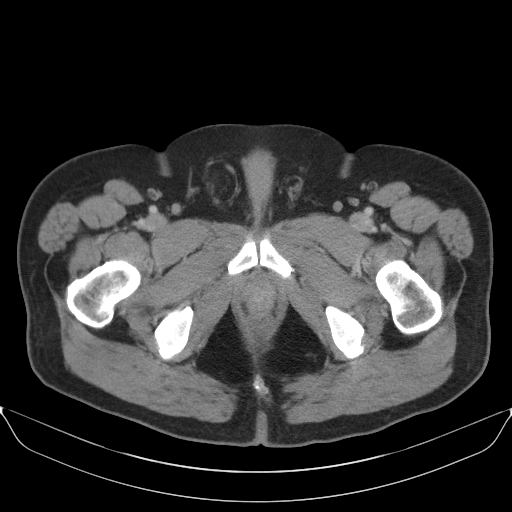
[im 22/101  soft-tissue]
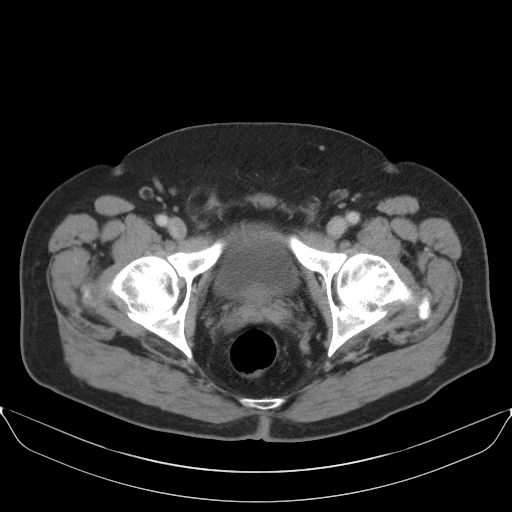
[im 32/101  soft-tissue]
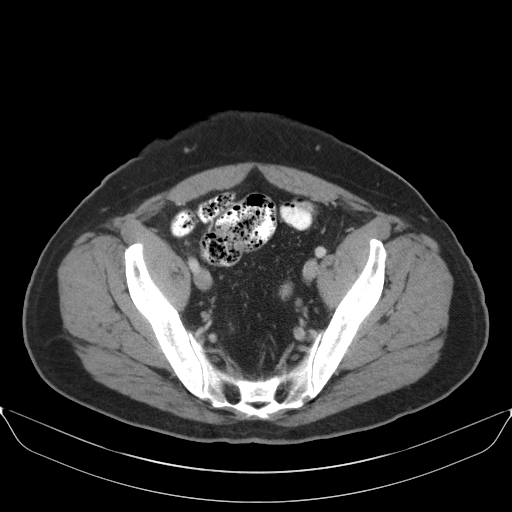
[im 37/101  soft-tissue]
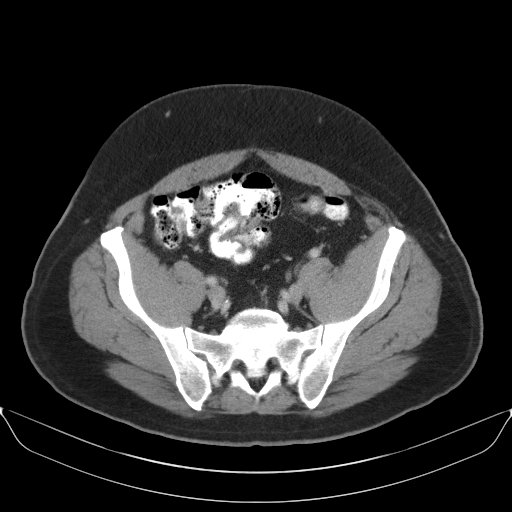
[im 48/101  soft-tissue]
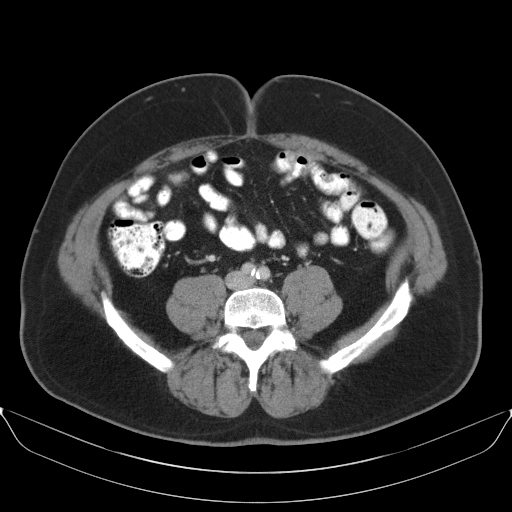
[im 53/101  soft-tissue]
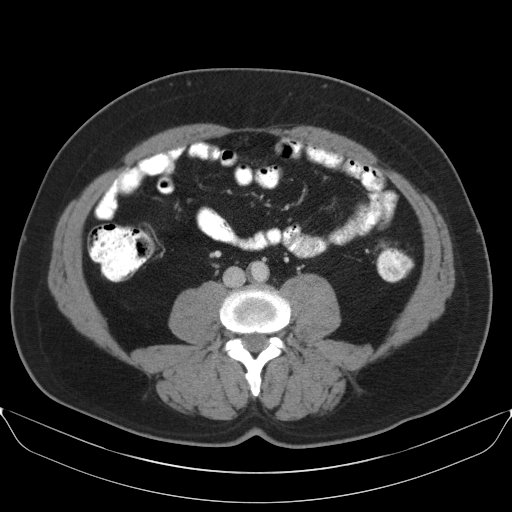
[im 64/101  soft-tissue]
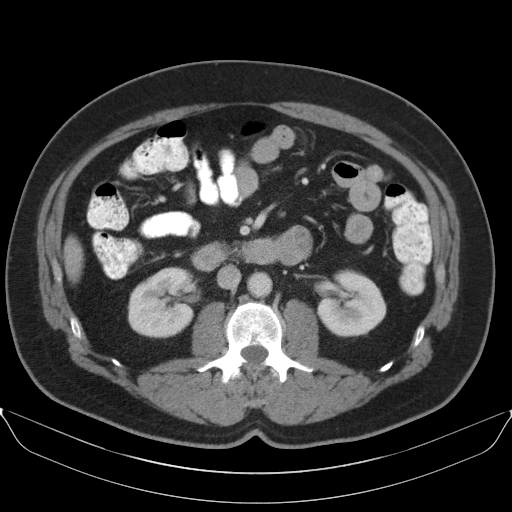
[im 69/101  soft-tissue]
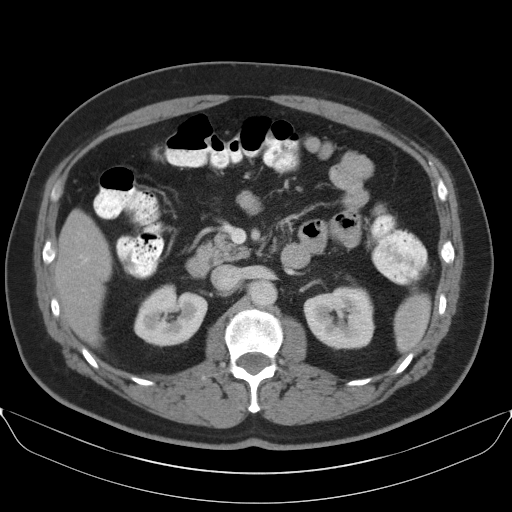
[im 69/101  bone]
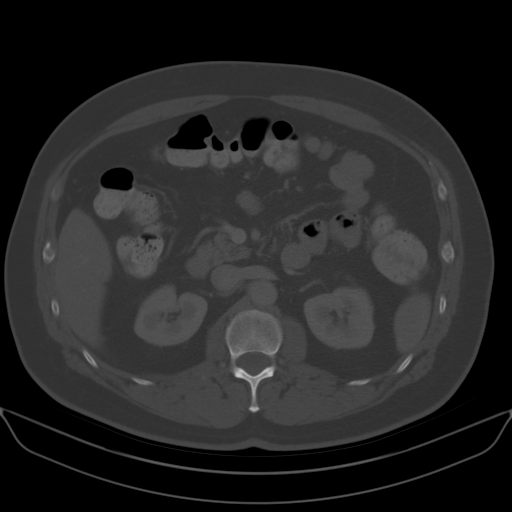
[im 79/101  soft-tissue]
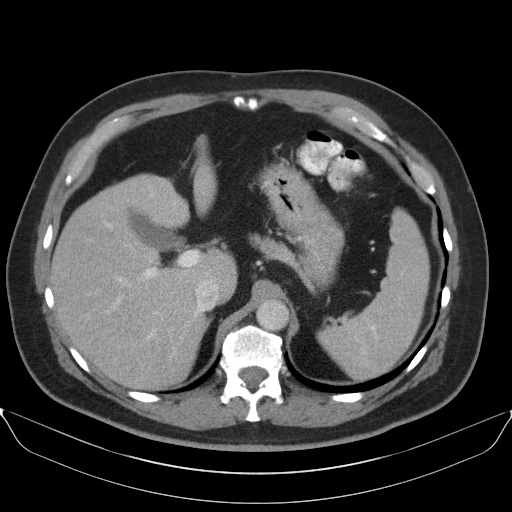
[im 85/101  soft-tissue]
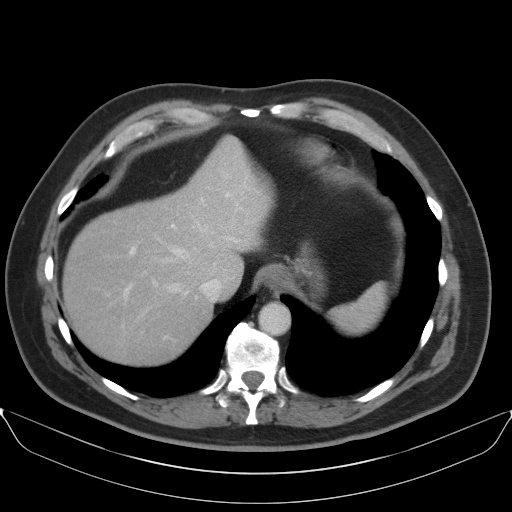
[im 95/101  soft-tissue]
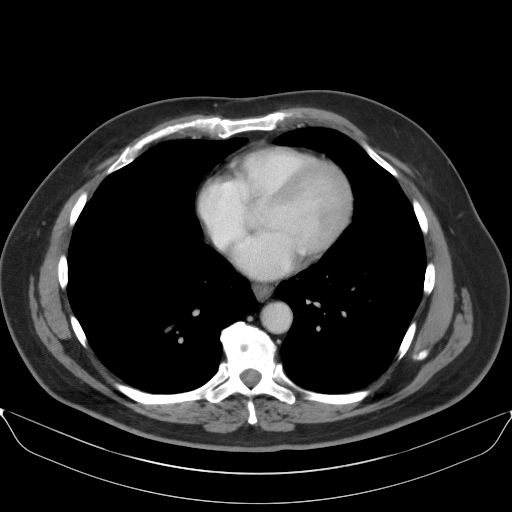

[Series 3: cor · coronal · 0.68mm/px · 3 of 100 slices shown]
[im 34/100  soft-tissue]
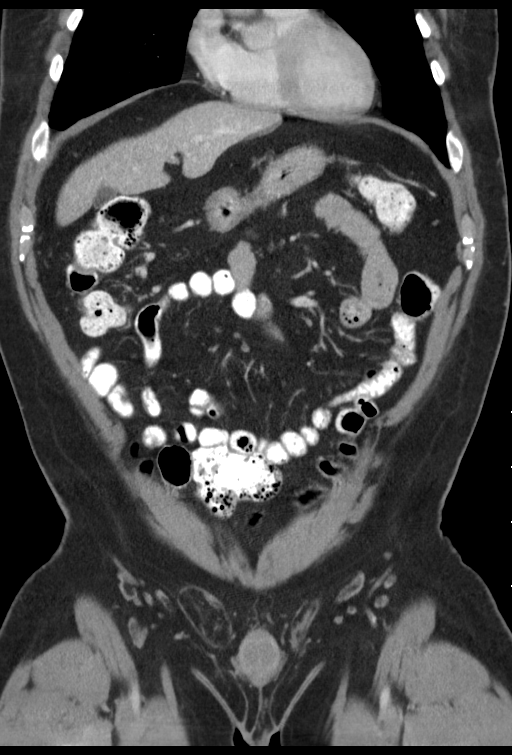
[im 45/100  soft-tissue]
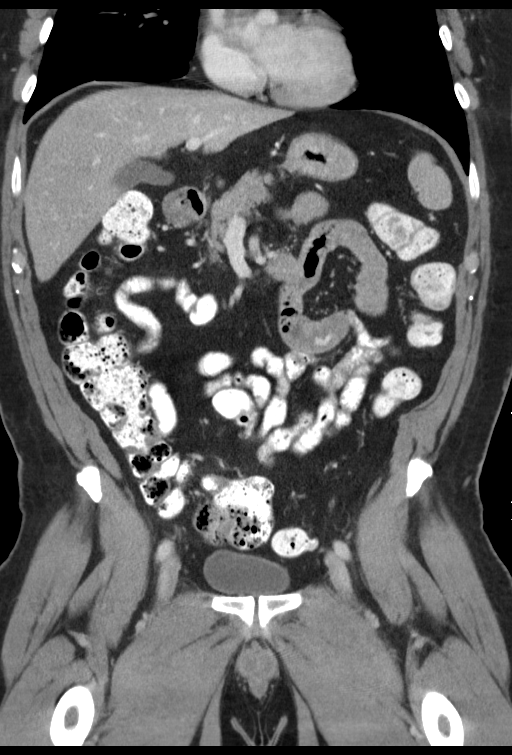
[im 56/100  soft-tissue]
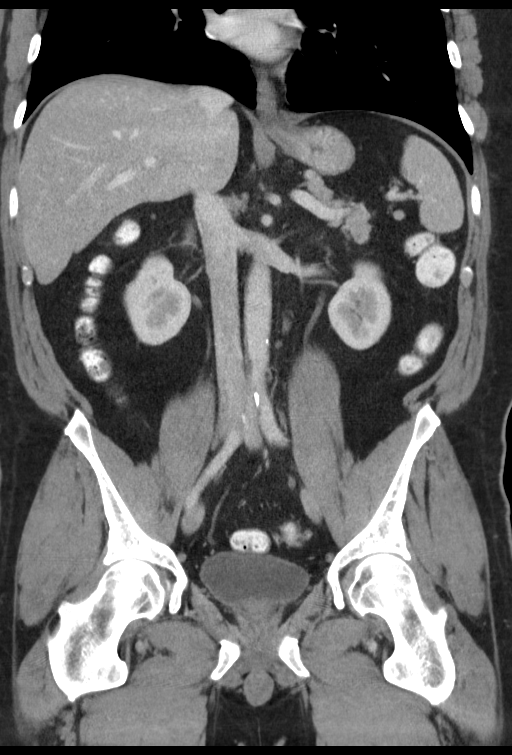

[15 of 46 positions shown; findings below may reference images not displayed]

FINDINGS: Lower chest: The lung bases are clear. The heart is within normal
limits in size.

Hepatobiliary: The liver enhances with no focal abnormality and no
ductal dilatation is seen. No definite gallstones are seen although
small polyps cannot be excluded along the posterior wall of the
gallbladder.

Pancreas: The pancreas is normal in size and the pancreatic duct is
not dilated.

Spleen: The spleen is unremarkable.

Adrenals/Urinary Tract: The adrenal glands appear normal. The
kidneys enhance with no calculus or mass and there is no evidence of
hydronephrosis. On delayed images, the pelvocaliceal systems or the
ureters are normal in caliber hand no distal ureteral calculus is
seen. The urinary bladder is only slightly urine distended with no
abnormality noted.

Stomach/Bowel: The stomach is decompressed and cannot be well
evaluated. No small bowel distention is seen. There are a few
rectosigmoid colon diverticula present. However, no diverticulitis
is seen.

Vascular/Lymphatic: The abdominal aorta is normal in caliber. Only
mild atheromatous changes present. No adenopathy is seen.

Reproductive: The prostate gland is normal in size.

Other: Fat does enter the right inguinal canal.

Musculoskeletal: The lumbar vertebrae are in normal alignment. No
compression deformity is seen. The SI joints are corticated.
IMPRESSION: 1. No explanation for the patient's left lower quadrant pain is
seen. There are scattered left lower quadrant diverticula but no
diverticulitis is noted.
2. No renal or ureteral calculi are noted.
3. Mild abdominal aortic atherosclerosis is present.
4. Fat does enter the right inguinal canal.

## 2021-02-17 DIAGNOSIS — Z03818 Encounter for observation for suspected exposure to other biological agents ruled out: Secondary | ICD-10-CM | POA: Diagnosis not present

## 2021-02-17 DIAGNOSIS — J0110 Acute frontal sinusitis, unspecified: Secondary | ICD-10-CM | POA: Diagnosis not present

## 2021-02-17 DIAGNOSIS — Z20822 Contact with and (suspected) exposure to covid-19: Secondary | ICD-10-CM | POA: Diagnosis not present

## 2021-02-17 DIAGNOSIS — R059 Cough, unspecified: Secondary | ICD-10-CM | POA: Diagnosis not present

## 2021-04-05 ENCOUNTER — Ambulatory Visit: Payer: BLUE CROSS/BLUE SHIELD | Admitting: Internal Medicine

## 2021-05-26 ENCOUNTER — Ambulatory Visit (INDEPENDENT_AMBULATORY_CARE_PROVIDER_SITE_OTHER): Payer: Medicare Other | Admitting: Family Medicine

## 2021-05-26 ENCOUNTER — Other Ambulatory Visit: Payer: Self-pay

## 2021-05-26 ENCOUNTER — Encounter: Payer: Self-pay | Admitting: Family Medicine

## 2021-05-26 VITALS — BP 132/76 | HR 87 | Temp 97.9°F | Resp 16 | Ht 65.0 in | Wt 189.9 lb

## 2021-05-26 DIAGNOSIS — L719 Rosacea, unspecified: Secondary | ICD-10-CM | POA: Diagnosis not present

## 2021-05-26 DIAGNOSIS — K219 Gastro-esophageal reflux disease without esophagitis: Secondary | ICD-10-CM

## 2021-05-26 DIAGNOSIS — Z23 Encounter for immunization: Secondary | ICD-10-CM

## 2021-05-26 DIAGNOSIS — E785 Hyperlipidemia, unspecified: Secondary | ICD-10-CM

## 2021-05-26 DIAGNOSIS — G40909 Epilepsy, unspecified, not intractable, without status epilepticus: Secondary | ICD-10-CM | POA: Diagnosis not present

## 2021-05-26 LAB — COMPREHENSIVE METABOLIC PANEL
ALT: 35 U/L (ref 0–53)
AST: 34 U/L (ref 0–37)
Albumin: 4.4 g/dL (ref 3.5–5.2)
Alkaline Phosphatase: 40 U/L (ref 39–117)
BUN: 19 mg/dL (ref 6–23)
CO2: 30 mEq/L (ref 19–32)
Calcium: 9.5 mg/dL (ref 8.4–10.5)
Chloride: 102 mEq/L (ref 96–112)
Creatinine, Ser: 1.22 mg/dL (ref 0.40–1.50)
GFR: 61.8 mL/min (ref >60.00)
Glucose, Bld: 92 mg/dL (ref 70–99)
Potassium: 3.7 mEq/L (ref 3.5–5.1)
Sodium: 139 mEq/L (ref 135–145)
Total Bilirubin: 1.2 mg/dL (ref 0.2–1.2)
Total Protein: 7.3 g/dL (ref 6.0–8.3)

## 2021-05-26 LAB — LIPID PANEL
Cholesterol: 165 mg/dL (ref 0–200)
HDL: 35.6 mg/dL — ABNORMAL LOW (ref >39.00)
LDL Cholesterol: 91 mg/dL (ref 0–99)
NonHDL: 129.69
Total CHOL/HDL Ratio: 5
Triglycerides: 195 mg/dL — ABNORMAL HIGH (ref 0.0–149.0)
VLDL: 39 mg/dL (ref 0.0–40.0)

## 2021-05-26 MED ORDER — HYDROCORTISONE-IODOQUINOL 1-1 % EX CREA: TOPICAL_CREAM | CUTANEOUS | 1 refills | Status: DC

## 2021-05-26 MED ORDER — ROSUVASTATIN CALCIUM 10 MG PO CPSP: 10.0000 mg | ORAL_CAPSULE | Freq: Every day | ORAL | 1 refills | Status: AC

## 2021-05-26 MED ORDER — DOXYCYCLINE HYCLATE 100 MG PO CAPS: 100.0000 mg | ORAL_CAPSULE | Freq: Every day | ORAL | 1 refills | Status: AC

## 2021-05-26 MED ORDER — LEVETIRACETAM 500 MG PO TABS: 1500.0000 mg | ORAL_TABLET | Freq: Two times a day (BID) | ORAL | 1 refills | Status: DC

## 2021-05-26 NOTE — Progress Notes (Signed)
Subjective:  Patient ID: Tyler Moran, male    DOB: [birth year]  Age: 65 y.o. MRN: [id number]  CC:  Chief Complaint  Patient presents with   New Patient (Initial Visit)    Initial visit no concerns      HPI Tyler Moran presents for  New patient establish care.  History of anxiety, GERD, seizure disorder, nephrolithiasis, IBS, rosacea, hyperlipidemia previous PCP Dr. Harrington Challenger with [facility]. Last visit in June.  No specialists following him at this time.   Seizure disorder Treated with Keppra 1500 mg twice daily.no refills needed now.  Last seizure in 2009, prior petite seizures. No current neurologist.  Prior saw Dr. Krista Blue.   Rosacea Doxycycline 100 mg daily, topical treatment hydrocortisone iodoquinol 1-1%, once or twice per week - working well.   GERD Taking otc nexium QD. Occasional tums needed. Breakthrough symptoms most nights - improves with tums. EGD in 2014 - Dr. 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   Hyperlipidemia: Previously followed through [facility] primary care, Labs noted from June 17th, normal LFTs with AST 53, ALT 36.  Glucose normal at 75.  Normal CBC, unable to see most recent lipid panel. Takes crestor 10mg  qd past year.  Fasting today.  Lab Results  Component Value Date   CHOL 223 (H) 10/10/2010   HDL 36.30 (L) 10/10/2010   LDLDIRECT 130.6 10/10/2010   TRIG 282.0 (H) 10/10/2010   CHOLHDL 6 10/10/2010   Lab Results  Component Value Date   ALT 41 10/10/2010   AST 29 10/10/2010   ALKPHOS 38 (L) 10/10/2010   BILITOT 0.7 10/10/2010   Flu vaccine today.  Covid vaccine: had J and J vaccine, no boosters, declined. Recommended bivalent vaccine booster.   History Patient Active Problem List   Diagnosis Date Noted   Acute sinus infection 12/08/2011   Shingles 12/08/2011   GERD (gastroesophageal reflux disease) 05/12/2011   Hemorrhoid 05/12/2011   IBS (irritable bowel syndrome) 05/12/2011   Hyperlipidemia 03/22/2011   Cervical myofascial pain syndrome  03/22/2011   Preventative health care 03/21/2011   ANXIETY 05/10/2009   GASTRITIS, CHRONIC 05/10/2009   INSOMNIA-SLEEP DISORDER-UNSPEC 05/10/2009   ELEVATED BLOOD PRESSURE WITHOUT DIAGNOSIS OF HYPERTENSION 05/10/2009   Other dysphagia 03/30/2009   EPIGASTRIC DISCOMFORT 03/30/2009   HEMORRHOIDS, EXTERNAL 02/23/2009   GERD 02/23/2009   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 Authorizing Provider  levETIRAcetam (KEPPRA) 500 MG tablet Take 1,500 mg by mouth every 12 (twelve) hours.     Yes [provider]  Rosuvastatin Calcium 10 MG CPSP Take 10 mg by mouth daily. 12/23/19  Yes [provider]  dexlansoprazole (DEXILANT) 60 MG capsule Take 1 capsule (60 mg total) by mouth daily. 02/24/13   Sable Feil, MD  doxycycline (VIBRAMYCIN) 100 MG capsule Take 100 mg by mouth daily. 12/29/20   [provider]  fluconazole (DIFLUCAN) 100 MG tablet Take 100 mg by  mouth daily.    [provider]  Hydrocortisone-Iodoquinol 1-1 % CREA Apply topically. 01/10/21   [provider]  nystatin-triamcinolone ointment Lilyan Gilford) apply to affected area three times a day 07/05/13   Sable Feil, MD  ranitidine (ACID REDUCER MAXIMUM STRENGTH) 150 MG tablet Take 150 mg by mouth 2 (two) times daily.    [provider]   Social History   Socioeconomic History   Marital status: Single    Spouse name: Not on file   Number of children: Not on file    Years of education: Not on file   Highest education level: Not on file  Occupational History   Not on file  Tobacco Use   Smoking status: Never   Smokeless tobacco: Never  Substance and Sexual Activity   Alcohol use: No   Drug use: No   Sexual activity: Yes  Other Topics Concern   Not on file  Social History Narrative   Not on file   Social Determinants of Health   Financial Resource Strain: Not on file  Food Insecurity: Not on file  Transportation Needs: Not on file  Physical Activity: Not on file  Stress: Not on file  Social Connections: Not on file  Intimate Partner Violence: Not on file    Review of Systems  Constitutional:  Negative for fatigue and unexpected weight change.  Eyes:  Negative for visual disturbance.  Respiratory:  Negative for cough, chest tightness and shortness of breath.   Cardiovascular:  Negative for chest pain, palpitations and leg swelling.  Gastrointestinal:  Negative for abdominal pain and blood in stool.  Neurological:  Negative for dizziness, light-headedness and headaches.    Objective:  There were no vitals filed for this visit.   Physical Exam Vitals reviewed.  Constitutional:      Appearance: He is well-developed.  HENT:     Head: Normocephalic and atraumatic.  Neck:     Vascular: No carotid bruit or JVD.  Cardiovascular:     Rate and Rhythm: Normal rate and regular rhythm.     Heart sounds: Normal heart sounds. No murmur heard. Pulmonary:     Effort: Pulmonary effort is normal.     Breath sounds: Normal breath sounds. No rales.  Abdominal:     Tenderness: There is no abdominal tenderness.  Musculoskeletal:     Right lower leg: No edema.     Left lower leg: No edema.  Skin:    General: Skin is warm and dry.  Neurological:     Mental Status: He is alert and oriented to person, place, and time.  Psychiatric:        Mood and Affect: Mood normal.       Assessment & Plan:  Tyler Moran is a 65 y.o. male  . Seizure disorder ([place]) - Plan: Ambulatory referral to Neurology, levETIRAcetam (KEPPRA) 500 MG tablet -Stable, continue same med regimen, refer to neurology.  If no changes, I can refill meds with intermittent monitoring by neurology.  Rosacea - Plan: Hydrocortisone-Iodoquinol 1-1 % CREA, doxycycline (VIBRAMYCIN) 100 MG capsule  -Stable on current regimen, continue topical treatment, as needed.  Doxycycline daily.  RTC precautions.  Hyperlipidemia, unspecified hyperlipidemia type - Plan: Rosuvastatin Calcium 10 MG CPSP, Comprehensive metabolic panel, Lipid panel  -Tolerating current dose statin, updated labs ordered.  Continue same regimen  Gastroesophageal reflux disease, unspecified whether esophagitis present - Plan: Comprehensive metabolic panel  -Intermittent breakthrough symptoms.  Continue PPI, trigger avoidance discussed with RTC precautions.  May need gastroenterology eval if persistent breakthrough symptoms, potentially may need repeat EGD.  Needs flu shot - Plan: Flu Vaccine QUAD High Dose(Fluad), CANCELED: Flu Vaccine QUAD 14mo+IM (Fluarix, Fluzone & Alfiuria Quad PF)   Meds ordered this encounter  Medications   Hydrocortisone-Iodoquinol 1-1 % CREA    Sig: Apply top as instructed.    Dispense:  28.4 g    Refill:  1    Ok to place on file if needed.   levETIRAcetam (KEPPRA) 500 MG tablet    Sig: Take 3 tablets (1,500 mg total) by mouth every 12 (twelve) hours.    Dispense:  270 tablet    Refill:  1    Ok to place on file if needed.   Rosuvastatin Calcium 10 MG CPSP    Sig: Take 10 mg by mouth daily.    Dispense:  90 capsule    Refill:  1    Ok to place on file if needed.   doxycycline (VIBRAMYCIN) 100 MG capsule    Sig: Take 1 capsule (100 mg total) by mouth daily.    Dispense:  90 capsule    Refill:  1    Patient Instructions  I do recommend follow up with a neurologist, I will refer you to Dr. Krista Blue but stay on same meds for now.   See list of foods that can  cause heartburn to see if there is room for improvement, continue nexium daily for now. Follow up in 1 month to discuss further and decide if gastroenterology visit is needed.   No med changes for now.   Return in 1 month to discuss labs, heartburn. Sooner if any worsening symptoms. Thanks for coming in today.   Food Choices for Gastroesophageal Reflux Disease, Adult When you have gastroesophageal reflux disease (GERD), the foods you eat and your eating habits are very important. Choosing the right foods can help ease your discomfort. Think about working with a food expert (dietitian) to help you make good choices. What are tips for following this plan? Reading food labels Look for foods that are low in saturated fat. Foods that may help with your symptoms include: Foods that have less than 5% of daily value (DV) of fat. Foods that have 0 grams of trans fat. Cooking Do not fry your food. Cook your food by baking, steaming, grilling, or broiling. These are all methods that do not need a lot of fat for cooking. To add flavor, try to use herbs that are low in spice and acidity. Meal planning  Choose healthy foods that are low in fat, such as: Fruits and vegetables. Whole grains. Low-fat dairy products. Lean meats, fish, and poultry. Eat small meals often instead of eating 3 large meals each day. Eat your meals slowly in a place where you are relaxed. Avoid bending over or lying down until 2-3 hours after eating. Limit high-fat foods such as fatty meats or fried foods. Limit your intake of fatty foods, such as oils, butter, and shortening. Avoid the following as told by your doctor: Foods that cause symptoms. These may be different for different people. Keep a food diary to keep track of foods that cause symptoms. Alcohol. Drinking a lot of liquid with meals. Eating meals during the 2-3 hours before bed. Lifestyle Stay at a healthy weight. Ask your doctor what weight is healthy for you.  If you need to lose weight, work with your doctor to do so safely. Exercise for at least 30 minutes on 5  or more days each week, or as told by your doctor. Wear loose-fitting clothes. Do not smoke or use any products that contain nicotine or tobacco. If you need help quitting, ask your doctor. Sleep with the head of your bed higher than your feet. Use a wedge under the mattress or blocks under the bed frame to raise the head of the bed. Chew sugar-free gum after meals. What foods should eat? Eat a healthy, well-balanced diet of fruits, vegetables, whole grains, low-fat dairy products, lean meats, fish, and poultry. Each person is different. Foods that may cause symptoms in one person may not cause any symptoms in another person. Work with your doctor to find foods that are safe for you. The items listed above may not be a complete list of what you can eat and drink. Contact a food expert for more options. What foods should I avoid? Limiting some of these foods may help in managing the symptoms of GERD. Everyone is different. Talk with a food expert or your doctor to help you find the exact foods to avoid, if any. Fruits Any fruits prepared with added fat. Any fruits that cause symptoms. For some people, this may include citrus fruits, such as oranges, grapefruit, pineapple, and lemons. Vegetables Deep-fried vegetables. [place] fries. Any vegetables prepared with added fat. Any vegetables that cause symptoms. For some people, this may include tomatoes and tomato products, chili peppers, onions and garlic, and horseradish. Grains Pastries or quick breads with added fat. Meats and other proteins High-fat meats, such as fatty beef or pork, hot dogs, ribs, ham, sausage, salami, and bacon. Fried meat or protein, including fried fish and fried chicken. Nuts and nut butters, in large amounts. Dairy Whole milk and chocolate milk. Sour cream. Cream. Ice cream. Cream cheese. Milkshakes. Fats and  oils Butter. Margarine. Shortening. Ghee. Beverages Coffee and tea, with or without caffeine. Carbonated beverages. Sodas. Energy drinks. Fruit juice made with acidic fruits, such as orange or grapefruit. Tomato juice. Alcoholic drinks. Sweets and desserts Chocolate and cocoa. Donuts. Seasonings and condiments Pepper. Peppermint and spearmint. Added salt. Any condiments, herbs, or seasonings that cause symptoms. For some people, this may include curry, hot sauce, or vinegar-based salad dressings. The items listed above may not be a complete list of what you should not eat and drink. Contact a food expert for more options. Questions to ask your doctor Diet and lifestyle changes are often the first steps that are taken to manage symptoms of GERD. If diet and lifestyle changes do not help, talk with your doctor about taking medicines. Where to find more information International Foundation for Gastrointestinal Disorders: aboutgerd.org Summary When you have GERD, food and lifestyle choices are very important in easing your symptoms. Eat small meals often instead of 3 large meals a day. Eat your meals slowly and in a place where you are relaxed. Avoid bending over or lying down until 2-3 hours after eating. Limit high-fat foods such as fatty meats or fried foods. This information is not intended to replace advice given to you by your health care provider. Make sure you discuss any questions you have with your health care provider. Document Revised: 01/19/2020 Document Reviewed: 01/19/2020 Elsevier Patient Education  2022 [place],   Merri Ray, MD [place] 05/26/21 10:17 AM

## 2021-05-26 NOTE — Patient Instructions (Addendum)
I do recommend follow up with a neurologist, I will refer you to Dr. Krista Blue but stay on same meds for now.   See list of foods that can cause heartburn to see if there is room for improvement, continue nexium daily for now. Follow up in 1 month to discuss further and decide if gastroenterology visit is needed.   No med changes for now.   Return in 1 month to discuss labs, heartburn. Sooner if any worsening symptoms. Thanks for coming in today.   Food Choices for Gastroesophageal Reflux Disease, Adult When you have gastroesophageal reflux disease (GERD), the foods you eat and your eating habits are very important. Choosing the right foods can help ease your discomfort. Think about working with a food expert (dietitian) to help you make good choices. What are tips for following this plan? Reading food labels Look for foods that are low in saturated fat. Foods that may help with your symptoms include: Foods that have less than 5% of daily value (DV) of fat. Foods that have 0 grams of trans fat. Cooking Do not fry your food. Cook your food by baking, steaming, grilling, or broiling. These are all methods that do not need a lot of fat for cooking. To add flavor, try to use herbs that are low in spice and acidity. Meal planning  Choose healthy foods that are low in fat, such as: Fruits and vegetables. Whole grains. Low-fat dairy products. Lean meats, fish, and poultry. Eat small meals often instead of eating 3 large meals each day. Eat your meals slowly in a place where you are relaxed. Avoid bending over or lying down until 2-3 hours after eating. Limit high-fat foods such as fatty meats or fried foods. Limit your intake of fatty foods, such as oils, butter, and shortening. Avoid the following as told by your doctor: Foods that cause symptoms. These may be different for different people. Keep a food diary to keep track of foods that cause symptoms. Alcohol. Drinking a lot of liquid with  meals. Eating meals during the 2-3 hours before bed. Lifestyle Stay at a healthy weight. Ask your doctor what weight is healthy for you. If you need to lose weight, work with your doctor to do so safely. Exercise for at least 30 minutes on 5 or more days each week, or as told by your doctor. Wear loose-fitting clothes. Do not smoke or use any products that contain nicotine or tobacco. If you need help quitting, ask your doctor. Sleep with the head of your bed higher than your feet. Use a wedge under the mattress or blocks under the bed frame to raise the head of the bed. Chew sugar-free gum after meals. What foods should eat? Eat a healthy, well-balanced diet of fruits, vegetables, whole grains, low-fat dairy products, lean meats, fish, and poultry. Each person is different. Foods that may cause symptoms in one person may not cause any symptoms in another person. Work with your doctor to find foods that are safe for you. The items listed above may not be a complete list of what you can eat and drink. Contact a food expert for more options. What foods should I avoid? Limiting some of these foods may help in managing the symptoms of GERD. Everyone is different. Talk with a food expert or your doctor to help you find the exact foods to avoid, if any. Fruits Any fruits prepared with added fat. Any fruits that cause symptoms. For some people, this may include citrus  fruits, such as oranges, grapefruit, pineapple, and lemons. Vegetables Deep-fried vegetables. [place] fries. Any vegetables prepared with added fat. Any vegetables that cause symptoms. For some people, this may include tomatoes and tomato products, chili peppers, onions and garlic, and horseradish. Grains Pastries or quick breads with added fat. Meats and other proteins High-fat meats, such as fatty beef or pork, hot dogs, ribs, ham, sausage, salami, and bacon. Fried meat or protein, including fried fish and fried chicken. Nuts and nut  butters, in large amounts. Dairy Whole milk and chocolate milk. Sour cream. Cream. Ice cream. Cream cheese. Milkshakes. Fats and oils Butter. Margarine. Shortening. Ghee. Beverages Coffee and tea, with or without caffeine. Carbonated beverages. Sodas. Energy drinks. Fruit juice made with acidic fruits, such as orange or grapefruit. Tomato juice. Alcoholic drinks. Sweets and desserts Chocolate and cocoa. Donuts. Seasonings and condiments Pepper. Peppermint and spearmint. Added salt. Any condiments, herbs, or seasonings that cause symptoms. For some people, this may include curry, hot sauce, or vinegar-based salad dressings. The items listed above may not be a complete list of what you should not eat and drink. Contact a food expert for more options. Questions to ask your doctor Diet and lifestyle changes are often the first steps that are taken to manage symptoms of GERD. If diet and lifestyle changes do not help, talk with your doctor about taking medicines. Where to find more information International Foundation for Gastrointestinal Disorders: aboutgerd.org Summary When you have GERD, food and lifestyle choices are very important in easing your symptoms. Eat small meals often instead of 3 large meals a day. Eat your meals slowly and in a place where you are relaxed. Avoid bending over or lying down until 2-3 hours after eating. Limit high-fat foods such as fatty meats or fried foods. This information is not intended to replace advice given to you by your health care provider. Make sure you discuss any questions you have with your health care provider. Document Revised: 01/19/2020 Document Reviewed: 01/19/2020 Elsevier Patient Education  [place].

## 2021-05-30 ENCOUNTER — Telehealth: Payer: Self-pay

## 2021-05-30 NOTE — Telephone Encounter (Signed)
Caller name:Dex Guia   On DPR? :No  Call back [phone]  Provider they see: Carlota Raspberry   Reason for call:Pt is calling seen Dr Carlota Raspberry on 11/3 and it was discussed to send patient to neurology Dr Krista Blue and Pt preferred to be sent to Dr Laury Deep [place] [phone]

## 2021-05-31 ENCOUNTER — Encounter: Payer: Self-pay | Admitting: Family Medicine

## 2021-06-03 ENCOUNTER — Other Ambulatory Visit: Payer: Self-pay

## 2021-06-03 ENCOUNTER — Telehealth: Payer: Self-pay

## 2021-06-03 DIAGNOSIS — G40909 Epilepsy, unspecified, not intractable, without status epilepticus: Secondary | ICD-10-CM

## 2021-06-03 MED ORDER — LEVETIRACETAM 250 MG PO TABS: ORAL_TABLET | ORAL | 1 refills | Status: DC

## 2021-06-03 NOTE — Telephone Encounter (Signed)
Caller name:Tyler Moran  On DPR? :no  Call back number:[phone]  Provider they see: Carlota Raspberry  Reason for call:Pt is come by the office regarding the levETIRAcetam (KEPPRA) 500 MG tablet  he is prescribed he was previously taking 250mg  taking 2 1/2 tablets by mouth twice daily and now it is 500 mg tablets take three tablets by mouth every 12 hours   Pt is thinking this is wrong nothing was said as to why the RX dosage and amount the patient takes has changed.

## 2021-06-03 NOTE — Telephone Encounter (Signed)
Dr Carlota Raspberry, I do not see anything in his chart about the 250 mg in question. Please advise

## 2021-06-03 NOTE — Telephone Encounter (Signed)
At his visit our understanding was that his dose was 1500 mg twice per day.  He should be taking the previous prescribed dose.  I did not plan any changes.  Please clarify his previous dose and prescription should reflect that dose of medication.  No changes.  Thanks

## 2021-06-03 NOTE — Telephone Encounter (Signed)
Patient presented to the office with a copy of his Rx from his pharmacy. The Rx that was sent in reflected from what was stated at his last visit. Patient correct dose is Levetiracetam 250 mg taking 2 and a half tabs by mouth twice a day. This Rx has been resent to patient pharmacy. Patient called and notified.

## 2021-06-03 NOTE — Telephone Encounter (Signed)
I called and spoke with patient about his concerns withy his dosage of Keppra. Patient stated that he will bring by a copy of his previous dosage on Monday. He could not locate it while we were on the phone.

## 2021-07-04 ENCOUNTER — Ambulatory Visit: Payer: Medicare Other | Admitting: Family Medicine

## 2021-08-25 ENCOUNTER — Ambulatory Visit: Payer: Self-pay

## 2021-08-25 DIAGNOSIS — G40909 Epilepsy, unspecified, not intractable, without status epilepticus: Secondary | ICD-10-CM

## 2021-08-25 MED ORDER — LEVETIRACETAM 250 MG PO TABS: ORAL_TABLET | ORAL | 1 refills | Status: AC

## 2021-08-25 NOTE — Progress Notes (Signed)
Letter noted, previous referral noted, has appointment with neurologist Dr. Cammy Brochure on 10/26/2021.  Refills ordered for now.

## 2021-08-25 NOTE — Progress Notes (Signed)
Pt sent letter, I have ledt her a VM stating we received this as she asked for notification of receipt I will place this in your file to review if you would like but pt has requested we delete the Levetiracetam 500 mg from the system and refill the 250mg 

## 2021-08-25 NOTE — Progress Notes (Signed)
Caller name:Harry Batte   Caller callback [phone]  Encourage patient to contact the pharmacy for refills or they can request refills through [facility]  (Please schedule appointment if patient has not been seen in over a year)  MEDICATION NAME & DOSE:levETIRAcetam ([place]) 250 MG tablet   Notes/Comments from patient:NOTE [place] insurance medication has to be changed   WHAT PHARMACY WOULD THEY LIKE THIS SENT TO: [facility]   Please notify patient: It takes 48-72 hours to process rx refill requests Ask patient to call pharmacy to ensure rx is ready before heading there.   (CLINICAL TO FILL OR ROUTE PER PROTOCOLS)

## 2021-08-26 ENCOUNTER — Other Ambulatory Visit: Payer: Self-pay

## 2021-08-26 DIAGNOSIS — L719 Rosacea, unspecified: Secondary | ICD-10-CM

## 2021-08-26 MED ORDER — HYDROCORTISONE-IODOQUINOL 1-1 % EX CREA: TOPICAL_CREAM | CUTANEOUS | 1 refills | Status: AC

## 2021-08-26 NOTE — Progress Notes (Signed)
Called left general message stating her Rx had been sent to the pharmacy

## 2021-12-14 ENCOUNTER — Encounter: Payer: Medicare HMO | Admitting: Family Medicine

## 2022-03-30 ENCOUNTER — Encounter: Payer: Medicare HMO | Admitting: Family Medicine

## 2023-03-16 LAB — EXTERNAL GENERIC LAB PROCEDURE: COLOGUARD: POSITIVE — AB

## 2023-04-11 ENCOUNTER — Encounter: Payer: Self-pay | Admitting: Gastroenterology

## 2023-07-23 ENCOUNTER — Encounter: Payer: Self-pay | Admitting: Gastroenterology

## 2023-08-09 ENCOUNTER — Ambulatory Visit: Payer: Medicare HMO | Admitting: Gastroenterology

## 2023-10-18 ENCOUNTER — Ambulatory Visit: Payer: Medicare HMO | Admitting: Gastroenterology
